# Patient Record
Sex: Male | Born: 1985 | Race: White | Hispanic: No | Marital: Single | Smoking: Never smoker
Health system: Southern US, Community
[De-identification: ages and names within clinical notes are randomized; demographics above are authoritative.]

## PROBLEM LIST (undated history)

## (undated) DIAGNOSIS — R002 Palpitations: Secondary | ICD-10-CM

## (undated) DIAGNOSIS — R079 Chest pain, unspecified: Secondary | ICD-10-CM

## (undated) DIAGNOSIS — I10 Essential (primary) hypertension: Secondary | ICD-10-CM

## (undated) DIAGNOSIS — R5383 Other fatigue: Secondary | ICD-10-CM

## (undated) DIAGNOSIS — R519 Headache, unspecified: Secondary | ICD-10-CM

## (undated) DIAGNOSIS — F988 Other specified behavioral and emotional disorders with onset usually occurring in childhood and adolescence: Secondary | ICD-10-CM

## (undated) DIAGNOSIS — J302 Other seasonal allergic rhinitis: Secondary | ICD-10-CM

## (undated) DIAGNOSIS — R011 Cardiac murmur, unspecified: Secondary | ICD-10-CM

## (undated) HISTORY — DX: Other specified behavioral and emotional disorders with onset usually occurring in childhood and adolescence: F98.8

## (undated) HISTORY — DX: Other fatigue: R53.83

## (undated) HISTORY — DX: Chest pain, unspecified: R07.9

## (undated) HISTORY — DX: Cardiac murmur, unspecified: R01.1

## (undated) HISTORY — DX: Essential (primary) hypertension: I10

## (undated) HISTORY — DX: Headache, unspecified: R51.9

## (undated) HISTORY — DX: Other seasonal allergic rhinitis: J30.2

## (undated) HISTORY — DX: Palpitations: R00.2

---

## 2010-05-11 ENCOUNTER — Emergency Department (HOSPITAL_COMMUNITY): Admission: EM | Admit: 2010-05-11 | Discharge: 2010-05-11 | Payer: Self-pay | Admitting: Emergency Medicine

## 2020-08-01 ENCOUNTER — Other Ambulatory Visit: Payer: Self-pay

## 2020-08-01 ENCOUNTER — Emergency Department (HOSPITAL_COMMUNITY): Payer: 59

## 2020-08-01 ENCOUNTER — Encounter (HOSPITAL_COMMUNITY): Payer: Self-pay

## 2020-08-01 ENCOUNTER — Emergency Department (HOSPITAL_COMMUNITY)
Admission: EM | Admit: 2020-08-01 | Discharge: 2020-08-01 | Disposition: A | Discharge status: Home / Self Care | Payer: 59 | Attending: Emergency Medicine | Admitting: Emergency Medicine

## 2020-08-01 DIAGNOSIS — I10 Essential (primary) hypertension: Secondary | ICD-10-CM | POA: Insufficient documentation

## 2020-08-01 DIAGNOSIS — J189 Pneumonia, unspecified organism: Secondary | ICD-10-CM

## 2020-08-01 DIAGNOSIS — Z20822 Contact with and (suspected) exposure to covid-19: Secondary | ICD-10-CM | POA: Insufficient documentation

## 2020-08-01 DIAGNOSIS — J181 Lobar pneumonia, unspecified organism: Secondary | ICD-10-CM | POA: Diagnosis not present

## 2020-08-01 DIAGNOSIS — R079 Chest pain, unspecified: Secondary | ICD-10-CM | POA: Diagnosis present

## 2020-08-01 LAB — BASIC METABOLIC PANEL
Anion gap: 13 (ref 5–15)
BUN: 18 mg/dL (ref 6–20)
CO2: 26 mmol/L (ref 22–32)
Calcium: 9.6 mg/dL (ref 8.9–10.3)
Chloride: 99 mmol/L (ref 98–111)
Creatinine, Ser: 1.08 mg/dL (ref 0.61–1.24)
GFR, Estimated: >60 mL/min (ref >60)
Glucose, Bld: 97 mg/dL (ref 70–99)
Potassium: 3.7 mmol/L (ref 3.5–5.1)
Sodium: 138 mmol/L (ref 135–145)

## 2020-08-01 LAB — CBC
HCT: 43.6 % (ref 39.0–52.0)
Hemoglobin: 15.7 g/dL (ref 13.0–17.0)
MCH: 32.2 pg (ref 26.0–34.0)
MCHC: 36 g/dL (ref 30.0–36.0)
MCV: 89.5 fL (ref 80.0–100.0)
Platelets: 215 10*3/uL (ref 150–400)
RBC: 4.87 MIL/uL (ref 4.22–5.81)
RDW: 11.7 % (ref 11.5–15.5)
WBC: 7.3 10*3/uL (ref 4.0–10.5)
nRBC: 0 % (ref 0.0–0.2)

## 2020-08-01 LAB — TROPONIN I (HIGH SENSITIVITY): Troponin I (High Sensitivity): 3 ng/L (ref <18)

## 2020-08-01 LAB — RESP PANEL BY RT-PCR (FLU A&B, COVID) ARPGX2
Influenza A by PCR: NEGATIVE
Influenza B by PCR: NEGATIVE
SARS Coronavirus 2 by RT PCR: NEGATIVE

## 2020-08-01 MED ORDER — DOXYCYCLINE HYCLATE 100 MG PO CAPS: 100.0000 mg | ORAL_CAPSULE | Freq: Two times a day (BID) | ORAL | 0 refills | Status: DC

## 2020-08-01 NOTE — ED Provider Notes (Signed)
[facility] EMERGENCY DEPARTMENT Provider Note   CSN: [id number] Arrival date & time: 08/01/20  0449     History Chief Complaint  Patient presents with  . Chest Pain    Bruce Mccormick is a 34 y.o. male.  HPI Patient presents with chest pain.  Has had for the last 3 days.  States it is dull in his anterior chest.  Does have some states he is also been very fatigued.  States he has had chills.  Not coughing but does little short of breath.  No sick contacts.  States that he did have a over-the-counter Covid test done yesterday and was negative. States he does have a family history of early cardiac disease.  He himself is never been looked at for his chest pain.  Does not smoke.  No history of high blood pressure.  States he is on Wellbutrin.  States he sees [place] primary somewhat frequently has not had elevated blood pressure before.    History reviewed. No pertinent past medical history.  There are no problems to display for this patient.   History reviewed. No pertinent surgical history.     No family history on file.     Home Medications Prior to Admission medications   Medication Sig Start Date End Date Taking? Authorizing Provider  doxycycline (VIBRAMYCIN) 100 MG capsule Take 1 capsule (100 mg total) by mouth 2 (two) times daily. 08/01/20   Benjiman Core, MD    Allergies    Patient has no allergy information on record.  Review of Systems   Review of Systems  Constitutional: Positive for chills. Negative for appetite change.  HENT: Negative for congestion.   Respiratory: Positive for shortness of breath. Negative for cough.   Cardiovascular: Positive for chest pain. Negative for leg swelling.  Gastrointestinal: Negative for abdominal pain.  Genitourinary: Negative for flank pain.  Musculoskeletal: Negative for back pain.  Skin: Negative for rash.  Neurological: Negative for weakness.  Psychiatric/Behavioral: Negative for confusion.     Physical Exam Updated Vital Signs BP (!) 151/106 (BP Location: Left Arm)   Pulse 85   Temp 97.6 F (36.4 C)   Resp 20   SpO2 100%   Physical Exam Vitals and nursing note reviewed.  HENT:     Head: Normocephalic.  Cardiovascular:     Rate and Rhythm: Normal rate.     Heart sounds: No murmur heard.   Pulmonary:     Breath sounds: No wheezing, rhonchi or rales.  Chest:     Chest wall: No tenderness.  Abdominal:     Palpations: Abdomen is soft.  Musculoskeletal:     Cervical back: Neck supple.     Right lower leg: No edema.     Left lower leg: No edema.  Skin:    General: Skin is warm.     Capillary Refill: Capillary refill takes less than 2 seconds.  Neurological:     Mental Status: He is alert and oriented to person, place, and time.     ED Results / Procedures / Treatments   Labs (all labs ordered are listed, but only abnormal results are displayed) Labs Reviewed  RESP PANEL BY RT-PCR (FLU A&B, COVID) ARPGX2  BASIC METABOLIC PANEL  CBC  TROPONIN I (HIGH SENSITIVITY)  TROPONIN I (HIGH SENSITIVITY)    EKG EKG Interpretation  Date/Time:  Wednesday August 01 2020 04:48:43 EST Ventricular Rate:  93 PR Interval:  156 QRS Duration: 102 QT Interval:  346 QTC Calculation: 430  R Axis:   53 Text Interpretation: Normal sinus rhythm Normal ECG Confirmed by Benjiman Core [phone]) on 08/01/2020 8:10:05 AM   Radiology DG Chest 2 View  Result Date: 08/01/2020 CLINICAL DATA:  Chest pain and chest tightness. EXAM: CHEST - 2 VIEW COMPARISON:  None. FINDINGS: Low lung volumes. Subtle nodular opacity in the parahilar left lung may reflect a small area of infectious or inflammatory alveolitis. Right lung clear. No pleural effusion. The cardiopericardial silhouette is within normal limits for size. The visualized bony structures of the thorax show no acute abnormality. IMPRESSION: Ill-defined nodular density left mid lung. Given patient age, this is likely infectious  or inflammatory etiology. Follow-up chest x-ray recommended to ensure resolution. Electronically Signed   By: Kennith Center M.D.   On: 08/01/2020 05:34    Procedures Procedures (including critical care time)  Medications Ordered in ED Medications - No data to display  ED Course  I have reviewed the triage vital signs and the nursing notes.  Pertinent labs & imaging results that were available during my care of the patient were reviewed by me and considered in my medical decision making (see chart for details).    MDM Rules/Calculators/A&P                          Patient with chest pain.  Has had for the last 3 days.  Dull.  Not exertional.  States he has been more tired however.  X-ray showed possible pneumonia.  With the chills he has been having will treat with antibiotics.  However has only had a Covid antigen test and Covid antibody test has been ordered.  Doubt cardiac cause of the chest pain.  Although he is at higher risk of family history he is otherwise low risk.  EKG reassuring troponin negative.  Heart pathway score of 2.  Will need outpatient follow-up due to the family history however. Blood pressure also elevated.  I think could be reactive to the stress of being in the ER since he has not had low blood pressure before.  With reassuring blood work and EKG do not think we need acute treatment but need short-term follow-up for recheck.  Has a primary care doctor he can follow-up with.  Initial differential diagnosis included but was not limited to pulmonary embolism coronary artery disease/ischemic heart disease.  Pneumonia pneumothorax nonspecific chest pain. Final Clinical Impression(s) / ED Diagnoses Final diagnoses:  Hypertension, unspecified type  Community acquired pneumonia of left lung, unspecified part of lung    Rx / DC Orders ED Discharge Orders         Ordered    doxycycline (VIBRAMYCIN) 100 MG capsule  2 times daily        08/01/20 0829            Benjiman Core, MD 08/01/20 [phone]

## 2020-08-01 NOTE — Discharge Instructions (Addendum)
Your x-ray showed a possible pneumonia.  With the fatigue and chills she been having we will treat it.  Your EKG and blood work for your heart both reassuring but with a family history you probably need following up with cardiology or released your primary care doctor. Your blood pressure was elevated and needs to be followed in the next few days for recheck.  Your Covid test was done but not resulted at the time of discharge.

## 2020-08-01 NOTE — ED Triage Notes (Signed)
Pt here for eval of central CP, described as tightness, also endorses "slight tingling" in L arm. Pt family hx of heart disease on both sides, denies cardiac history himself. Pt denies N/V, dizziness, hypertensive in triage.

## 2020-08-29 ENCOUNTER — Other Ambulatory Visit: Payer: Self-pay

## 2020-08-29 ENCOUNTER — Ambulatory Visit: Payer: 59 | Admitting: Internal Medicine

## 2020-08-29 ENCOUNTER — Encounter: Payer: Self-pay | Admitting: *Deleted

## 2020-08-29 ENCOUNTER — Encounter: Payer: Self-pay | Admitting: Internal Medicine

## 2020-08-29 VITALS — BP 142/94 | HR 108 | Ht 67.0 in | Wt 219.0 lb

## 2020-08-29 DIAGNOSIS — I10 Essential (primary) hypertension: Secondary | ICD-10-CM | POA: Diagnosis not present

## 2020-08-29 DIAGNOSIS — R079 Chest pain, unspecified: Secondary | ICD-10-CM

## 2020-08-29 MED ORDER — HYDROCHLOROTHIAZIDE 25 MG PO TABS: 25.0000 mg | ORAL_TABLET | Freq: Every day | ORAL | 6 refills | Status: DC

## 2020-08-29 MED ORDER — AMLODIPINE BESYLATE 10 MG PO TABS: 10.0000 mg | ORAL_TABLET | Freq: Every day | ORAL | 6 refills | Status: DC

## 2020-08-29 NOTE — Progress Notes (Signed)
Cardiology Office Note:    Date:  08/29/2020   ID:  Bruce Mccormick, DOB [birth year], MRN [id number]  PCP:  Alvia [place], [facility]  [facility] Cardiologist:  Christell ConstantMahesh A Rande Roylance, MD  [facility] Electrophysiologist:  None   CC: Chest pain  Consulted for the evaluation of chest pain and palpitations at the behest of [place], [facility]  History of Present Illness:    Bruce SeraMason Wever is a 35 y.o. male with a hx of chest pain and palpitations who presents for evaluation.  Patient notes that he is feeling pretty run down. One month ago had fevers and palpitations at work; had negative COVID-19 test.  Had frequent palpitations.  Had EF evaluation with concerns for PNA and started doxycycline.  Elevated blood pressure at that time.  Was told that he has high blood pressure, taken clonidine and felt worse.   Has had chest pain that correlates to increase in his blood pressure.  Also associated with headache.  Discomfort occurs with BP in the 120 or 110.  Patient exertion notable for significant change in exercise; in the non-distant past had ran a marathon.  Notes new weight gain.  Notes no palpitations or funny heart beats.   His heart rate has been running fast since he has been having his elevation in blood pressure and having pain  Ambulatory BP 150-160/108-110; thought his cuff runs a bit higher than manual.   Past Medical History:  Diagnosis Date  . ADD (attention deficit disorder)   . Chest pain   . Fatigue   . Frequent headaches   . Heart murmur   . Hypertension   . Palpitations   . Seasonal allergies     History reviewed. No pertinent surgical history.  Current Medications: Current Meds  Medication Sig  . amLODipine (NORVASC) 10 MG tablet Take 1 tablet (10 mg total) by mouth daily.  . hydrochlorothiazide (HYDRODIURIL) 25 MG tablet Take 1 tablet (25 mg total) by mouth daily.  Marland Kitchen. ibuprofen (ADVIL) 800 MG tablet Take 1 tablet by mouth as directed.   . zolpidem (AMBIEN CR) 12.5 MG CR tablet Take 12.5 mg by mouth at bedtime as needed.  . [DISCONTINUED] allopurinol (ZYLOPRIM) 100 MG tablet Take 100 mg by mouth daily.  . [DISCONTINUED] amLODipine (NORVASC) 5 MG tablet Take 1.5 tablets by mouth daily.  . [DISCONTINUED] buPROPion (WELLBUTRIN XL) 150 MG 24 hr tablet Take 150 mg by mouth every morning.  . [DISCONTINUED] buPROPion (WELLBUTRIN XL) 300 MG 24 hr tablet Take 300 mg by mouth every morning.  . [DISCONTINUED] doxycycline (VIBRAMYCIN) 100 MG capsule Take 1 capsule (100 mg total) by mouth 2 (two) times daily.  . [DISCONTINUED] hydrochlorothiazide (HYDRODIURIL) 25 MG tablet Take 0.5 tablets by mouth as directed. 1/2 table for 2 weeks, then increase to 1 tablet in the morning     Allergies:   Patient has no allergy information on record.   Social History   Socioeconomic History  . Marital status: Single    Spouse name: Not on file  . Number of children: Not on file  . Years of education: Not on file  . Highest education level: Not on file  Occupational History  . Not on file  Tobacco Use  . Smoking status: Never Smoker  . Smokeless tobacco: Never Used  Substance and Sexual Activity  . Alcohol use: Yes    Alcohol/week: 2.0 standard drinks    Types: 2 Shots of liquor per week  .  Drug use: Never  . Sexual activity: Not on file  Other Topics Concern  . Not on file  Social History Narrative  . Not on file   Social Determinants of Health   Financial Resource Strain: Not on file  Food Insecurity: Not on file  Transportation Needs: Not on file  Physical Activity: Not on file  Stress: Not on file  Social Connections: Not on file     Family History: The patient's family history includes Heart Problems in his father and mother. History of coronary artery disease notable for multiple family members in 85s. History of heart failure notable for no members.  ROS:   Please see the history of present illness.     All other  systems reviewed and are negative.  EKGs/Labs/Other Studies Reviewed:    The following studies were reviewed today:  EKG:   08/01/20:  SR rate 93 WNL  Recent Labs: 08/01/2020: BUN 18; Creatinine, Ser 1.08; Hemoglobin 15.7; Platelets 215; Potassium 3.7; Sodium 138  Recent Lipid Panel No results found for: CHOL, TRIG, HDL, CHOLHDL, VLDL, LDLCALC, LDLDIRECT   Risk Assessment/Calculations:     N/A  Physical Exam:    VS:  BP (!) 142/94   Pulse (!) 108   Ht 5\' 7"  (1.702 m)   Wt 219 lb (99.3 kg)   SpO2 97%   BMI 34.30 kg/m     Wt Readings from Last 3 Encounters:  08/29/20 219 lb (99.3 kg)     GEN:  Well nourished, well developed in no acute distress HEENT: Normal NECK: No JVD; No carotid bruits LYMPHATICS: No lymphadenopathy CARDIAC: RRR, no murmurs, rubs, gallops RESPIRATORY:  Clear to auscultation without rales, wheezing or rhonchi  ABDOMEN: Soft, non-tender, non-distended MUSCULOSKELETAL:  No edema; No deformity  SKIN: Warm and dry NEUROLOGIC:  Alert and oriented x 3 PSYCHIATRIC:  Normal affect   ASSESSMENT:    1. Chest pain of uncertain etiology   2. Hypertension, unspecified type    PLAN:    Chest Pain - Would recommend exercise stress test (NPO at midnight); discussed risks, benefits, and alternatives of the diagnostic procedure including chest pain, arrhythmia, and death.  Patient amenable for testing. - if positive, discussed risks and benefits of cardiac catheterization have been discussed with the patient.  These include bleeding, infection, kidney damage, stroke, heart attack, death.  The patient understands these risks and is willing to proceed if necessary  Essential Hypertension - ambulatory blood pressure 150-160/108-110; thought his cuff runs a bit higher than manual, will continue ambulatory BP monitoring; gave education on how to perform ambulatory blood pressure monitoring including the frequency and technique; goal ambulatory blood pressure <  135/85 on average - continue home medications with the exception of increase your amlodipine and HCTZ - will get labs in 10-14 days (BMET, Mg) at Pharm D visit - OSA Risk low , Epworth Sleepiness Scale less than than 15 - discussed causes of  secondary HTN, will order renal artery duplex, and Aldo/renin if persistent elevation in BP despite three maxed out medications including diuretic - discussed diet (DASH/low sodium), and exercise/weight loss interventions    Shared Decision Making/Informed Consent The risks [chest pain, shortness of breath, cardiac arrhythmias, dizziness, blood pressure fluctuations, myocardial infarction, stroke/transient ischemic attack, and life-threatening complications (estimated to be 1 in 10,000)], benefits (risk stratification, diagnosing coronary artery disease, treatment guidance) and alternatives of an exercise tolerance test were discussed in detail with Mr. Humble and he agrees to proceed.  Medication Adjustments/Labs and Tests Ordered: Current medicines are reviewed at length with the patient today.  Concerns regarding medicines are outlined above.  Orders Placed This Encounter  Procedures  . Basic Metabolic Panel (BMET)  . Magnesium  . AMB Referral to [facility] Pharm-D  . Cardiac Stress Test: Informed Consent Details: Physician/Practitioner Attestation; Transcribe to consent form and obtain patient signature  . EXERCISE TOLERANCE TEST (ETT)   Meds ordered this encounter  Medications  . hydrochlorothiazide (HYDRODIURIL) 25 MG tablet    Sig: Take 1 tablet (25 mg total) by mouth daily.    Dispense:  30 tablet    Refill:  6    Dose increase  . amLODipine (NORVASC) 10 MG tablet    Sig: Take 1 tablet (10 mg total) by mouth daily.    Dispense:  30 tablet    Refill:  6    Dose increase    Patient Instructions  Medication Instructions:  Your physician has recommended you make the following change in your medication: Increase amlodipine to 10 mg  by mouth daily. Increase hydrochlorothiazide to 25 mg by mouth daily  *If you need a refill on your cardiac medications before your next appointment, please call your pharmacy*   Lab Work: Your physician recommends that you return for lab work on day of appointment in hypertension clinic--BMP  If you have labs (blood work) drawn today and your tests are completely normal, you will receive your results only by: Marland Kitchen MyChart Message (if you have MyChart) OR . A paper copy in the mail If you have any lab test that is abnormal or we need to change your treatment, we will call you to review the results.   Testing/Procedures: Your physician has requested that you have an exercise tolerance test. For further information please visit [email or web address] Please also follow instruction sheet, as given. You will need covid testing prior to stress test  You have been referred to Hypertension clinic.  Please schedule appointment for 2-3 weeks from now    Follow-Up: At [facility], you and your health needs are our priority.  As part of our continuing mission to provide you with exceptional heart care, we have created designated Provider Care Teams.  These Care Teams include your primary Cardiologist (physician) and Advanced Practice Providers (APPs -  Physician Assistants and Nurse Practitioners) who all work together to provide you with the care you need, when you need it.  We recommend signing up for the patient portal called "MyChart".  Sign up information is provided on this After Visit Summary.  MyChart is used to connect with patients for Virtual Visits (Telemedicine).  Patients are able to view lab/test results, encounter notes, upcoming appointments, etc.  Non-urgent messages can be sent to your provider as well.   To learn more about what you can do with MyChart, go to [email or web address].    Your next appointment:   About 6  week(s)  The format for your next appointment:   In  Person  Provider:   You may see Christell Constant, MD or one of the following Advanced Practice Providers on your designated Care Team:    Ronie Spies, PA-C  Jacolyn Reedy, PA-C    Other Instructions  Due to recent COVID-19 restrictions implemented by our local and state authorities and in an effort to keep both patients and staff as safe as possible, our hospital system requires COVID-19 testing prior to certain scheduled hospital procedures.  Please go to [address]. [place],  [place] [address].  This is a drive up testing site.  You will not need to exit your vehicle.  You will not be billed at the time of testing but may receive a bill later depending on your insurance. You must agree to self-quarantine from the time of your testing until the procedure date  This should included staying home with ONLY the people you live with.  Avoid take-out, grocery store shopping or leaving the house for any non-emergent reason.  Failure to have your COVID-19 test done on the date and time you have been scheduled will result in cancellation of your procedure.  Please call our office at [phone] if you have any questions.      Signed, Christell Constant, MD  08/29/2020 10:32 AM    [facility]

## 2020-08-29 NOTE — Patient Instructions (Signed)
Medication Instructions:  Your physician has recommended you make the following change in your medication: Increase amlodipine to 10 mg by mouth daily. Increase hydrochlorothiazide to 25 mg by mouth daily  *If you need a refill on your cardiac medications before your next appointment, please call your pharmacy*   Lab Work: Your physician recommends that you return for lab work on day of appointment in hypertension clinic--BMP  If you have labs (blood work) drawn today and your tests are completely normal, you will receive your results only by: Marland Kitchen MyChart Message (if you have MyChart) OR . A paper copy in the mail If you have any lab test that is abnormal or we need to change your treatment, we will call you to review the results.   Testing/Procedures: Your physician has requested that you have an exercise tolerance test. For further information please visit [email or web address] Please also follow instruction sheet, as given. You will need covid testing prior to stress test  You have been referred to Hypertension clinic.  Please schedule appointment for 2-3 weeks from now    Follow-Up: At [facility], you and your health needs are our priority.  As part of our continuing mission to provide you with exceptional heart care, we have created designated Provider Care Teams.  These Care Teams include your primary Cardiologist (physician) and Advanced Practice Providers (APPs -  Physician Assistants and Nurse Practitioners) who all work together to provide you with the care you need, when you need it.  We recommend signing up for the patient portal called "MyChart".  Sign up information is provided on this After Visit Summary.  MyChart is used to connect with patients for Virtual Visits (Telemedicine).  Patients are able to view lab/test results, encounter notes, upcoming appointments, etc.  Non-urgent messages can be sent to your provider as well.   To learn more about what you can do with  MyChart, go to [email or web address].    Your next appointment:   About 6  week(s)  The format for your next appointment:   In Person  Provider:   You may see Christell Constant, MD or one of the following Advanced Practice Providers on your designated Care Team:    Ronie Spies, PA-C  Jacolyn Reedy, PA-C    Other Instructions  Due to recent COVID-19 restrictions implemented by our local and state authorities and in an effort to keep both patients and staff as safe as possible, our hospital system requires COVID-19 testing prior to certain scheduled hospital procedures.  Please go to [phone]. [place], [place] [address].  This is a drive up testing site.  You will not need to exit your vehicle.  You will not be billed at the time of testing but may receive a bill later depending on your insurance. You must agree to self-quarantine from the time of your testing until the procedure date  This should included staying home with ONLY the people you live with.  Avoid take-out, grocery store shopping or leaving the house for any non-emergent reason.  Failure to have your COVID-19 test done on the date and time you have been scheduled will result in cancellation of your procedure.  Please call our office at [phone] if you have any questions.

## 2020-09-04 ENCOUNTER — Other Ambulatory Visit (HOSPITAL_COMMUNITY)
Admission: RE | Admit: 2020-09-04 | Discharge: 2020-09-04 | Disposition: A | Discharge status: Home / Self Care | Payer: 59 | Source: Ambulatory Visit | Attending: Internal Medicine | Admitting: Internal Medicine

## 2020-09-04 DIAGNOSIS — Z01812 Encounter for preprocedural laboratory examination: Secondary | ICD-10-CM | POA: Insufficient documentation

## 2020-09-04 DIAGNOSIS — Z20822 Contact with and (suspected) exposure to covid-19: Secondary | ICD-10-CM | POA: Diagnosis not present

## 2020-09-04 LAB — SARS CORONAVIRUS 2 (TAT 6-24 HRS): SARS Coronavirus 2: NEGATIVE

## 2020-09-06 ENCOUNTER — Ambulatory Visit (INDEPENDENT_AMBULATORY_CARE_PROVIDER_SITE_OTHER): Payer: 59

## 2020-09-06 ENCOUNTER — Other Ambulatory Visit: Payer: Self-pay

## 2020-09-06 DIAGNOSIS — I10 Essential (primary) hypertension: Secondary | ICD-10-CM | POA: Diagnosis not present

## 2020-09-06 LAB — EXERCISE TOLERANCE TEST
Estimated workload: 11.5 METS
Exercise duration (min): 9 min
Exercise duration (sec): 37 s
MPHR: 187 {beats}/min
Peak HR: 164 {beats}/min
Percent HR: 87 %
RPE: 17
Rest HR: 93 {beats}/min

## 2020-09-09 NOTE — Progress Notes (Signed)
Patient ID: Bruce Mccormick                 DOB: [birth year]                      MRN: [id number]     HPI: Bruce Mccormick is a 35 y.o. male referred by Dr. Izora Ribas to HTN clinic. PMH is significant for HTN, depression, anxiety, ADD, and gout. He was recently hospitalized 08/01/2020 for 3-day chest pain with a negative over-the-counter COVID test. He was found to have pneumonia and treated with doxycycline. He was referred to Dr. Tiajuana Amass for f/u on chest pain and was last seen in clinic 08/29/2020. Clinic BP 142/94 mmHg and reported ambulatory readings ranging from 150-160/108-110, and pt believes that his cuff runs higher than manual. His  amlodipine increased to 10 mg daily and HCTZ increased to 25 mg daily.  He underwent a stress test 09/06/20 which was low risk. Baseline blood pressure was 144/103 mmHg and rose to 224/99 mmHg with no EKG changes or chest pain. He was continued on his current BP regimen.   Today he presents for HTN clinic for initial visit. He reports that since increasing amlodipine and HCTZ, he does not feel any improvement and actually feels worse. He states that his headaches have gotten worse. He reports that his chest pain has not improved and that he "feels like his heart is beating out of [my] chest when going to bed." Of note, he reports that he self-discontinued bupropion XL (300 mg daily) 3-4 weeks ago to see if this would help with lowering BP. He also reports discontinuing allopurinol secondary to no recent gout flares, with most recent episode in mid-November. Has not needed to take colchicine.   He reports seeing his PCP last Friday to fill out FMLA paperwork. During visit, he reports having the lowest BP reading since starting his medications with an SBP of 132 and does not remember DBP. He reports that his PCP said that his heart "was working hard." He was checking his BP at home with an Omron cuff 1-3x a day but decreased it to once daily after PCP advised that BP  does not to be checked that frequently. He has now stopped checking his BP for over two weeks. He states that he knows when his BP is high because his headache worsens. He denies any new stressors in his life. He would like to get back to work as soon as he can. Clinic BP today 138/100 mmHg.   Current HTN meds:  Amlodipine 10 mg daily  HCTZ 25 mg daily  Previously tried: Clonidine 0.1 mg BID - took and "felt worse"  BP goal: < 130/80 mmHg  Family History: Heart problems in father/mother, CAD in multiple family members in their 62s  Social History: Never smoked, 2.0 standard drinks/week  Diet: Mostly snacks and 1 full meal/day d/t being on third shift schedule for his job. On the weekends will eat 2 full meals/day. Usually more carbs and meat.  Exercise: Used to go to gym, ran a marathon about 5 years ago. 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 He would like to get back to the gym but first wants to return to his job.  Home BP readings: not checking at home  Labs:  08/01/2020: Na 138, K 3.7, Scr 1.08   Wt Readings from Last 3 Encounters:  08/29/20 219 lb (99.3 kg)   BP Readings from Last 3 Encounters:  08/29/20 (!) 142/94  08/01/20 (!) 152/116   Pulse Readings from Last 3 Encounters:  08/29/20 (!) 108  08/01/20 81    Renal function: CrCl cannot be calculated (Patient's most recent lab result is older than the maximum 21 days allowed.).  Past Medical History:  Diagnosis Date  . ADD (attention deficit disorder)   . Chest pain   . Fatigue   . Frequent headaches   . Heart murmur   . Hypertension   . Palpitations   . Seasonal allergies     Current Outpatient Medications on File Prior to Visit  Medication Sig Dispense Refill  . amLODipine (NORVASC) 10 MG tablet Take 1 tablet (10 mg total) by mouth daily. 30 tablet 6  . hydrochlorothiazide (HYDRODIURIL) 25 MG tablet Take 1 tablet (25 mg total) by mouth daily. 30 tablet 6  . ibuprofen (ADVIL) 800 MG tablet  Take 1 tablet by mouth as directed.    . zolpidem (AMBIEN CR) 12.5 MG CR tablet Take 12.5 mg by mouth at bedtime as needed.     No current facility-administered medications on file prior to visit.    Not on File   Assessment/Plan:  1. Hypertension - BP 138/100 mmHg elevated and above goal < 130/80 mmHg. Pt also still experiencing chest pain despite normal stress test. Pt also having no improvement in headaches, however, unclear what is the cause d/t multiple medication changes in the last weeks including increasing amlodipine, increasing HCTZ, self-discontinuing high dose bupropion, and pt not checking BP at home. Will start carvedilol 3.125 mg BID secondary to increased heart rate and uncontrolled BP. Will continue amlodipine 10 mg daily and HCTZ 25 mg daily. Checking BMET today due to recent increase in HCTZ dose. Encouraged patient to restart checking BP once a day and whenever he feels that his BP is elevated. Pt encouraged to bring BP log at next visit. Pt encouraged to swap 1 snack for 1 fruit throughout the day. Provided patient with Dash diet pamphlet for low sodium diet plan. Encouraged patient to begin walking 10 minutes a day as tolerated to help with blood pressure lowering while he waits to go back to work. Will f/u with patient in 2 weeks for BP check. Discussed plan with Dr. Izora Ribas and he is in agreement with beta blocker initiation and feels that chest pain is secondary to uncontrolled HTN. He will f/u with patient mid-February as well.   Sula Soda, PharmD Candidate  Megan E. Supple, PharmD, BCACP, CPP [facility] [address], [place], [place] [address] Phone: [phone]; Fax: [phone] 09/10/2020 12:27 PM

## 2020-09-10 ENCOUNTER — Ambulatory Visit (INDEPENDENT_AMBULATORY_CARE_PROVIDER_SITE_OTHER): Payer: 59 | Admitting: Pharmacist

## 2020-09-10 ENCOUNTER — Other Ambulatory Visit: Payer: 59 | Admitting: *Deleted

## 2020-09-10 ENCOUNTER — Other Ambulatory Visit: Payer: Self-pay

## 2020-09-10 DIAGNOSIS — I10 Essential (primary) hypertension: Secondary | ICD-10-CM | POA: Diagnosis not present

## 2020-09-10 DIAGNOSIS — R072 Precordial pain: Secondary | ICD-10-CM | POA: Insufficient documentation

## 2020-09-10 DIAGNOSIS — R079 Chest pain, unspecified: Secondary | ICD-10-CM

## 2020-09-10 LAB — MAGNESIUM: Magnesium: 2.1 mg/dL (ref 1.6–2.3)

## 2020-09-10 LAB — BASIC METABOLIC PANEL
BUN/Creatinine Ratio: 16 (ref 9–20)
BUN: 15 mg/dL (ref 6–20)
CO2: 27 mmol/L (ref 20–29)
Calcium: 9.5 mg/dL (ref 8.7–10.2)
Chloride: 102 mmol/L (ref 96–106)
Creatinine, Ser: 0.94 mg/dL (ref 0.76–1.27)
GFR calc Af Amer: 123 mL/min/{1.73_m2} (ref >59)
GFR calc non Af Amer: 106 mL/min/{1.73_m2} (ref >59)
Glucose: 106 mg/dL — ABNORMAL HIGH (ref 65–99)
Potassium: 4.3 mmol/L (ref 3.5–5.2)
Sodium: 141 mmol/L (ref 134–144)

## 2020-09-10 MED ORDER — CARVEDILOL 3.125 MG PO TABS: 3.1250 mg | ORAL_TABLET | Freq: Two times a day (BID) | ORAL | 11 refills | Status: DC

## 2020-09-10 NOTE — Patient Instructions (Addendum)
It was great to meet you today!  Your blood pressure today was 138/100 mmHg. Your blood pressure goal is less than 130/80 mmHg.  Start taking carvedilol 1 tablet (3.125 mg) twice a day.  Continue taking amlodipine 1 tablet (10 mg) every day.   Continue taking hydrochlorothiazide 1 tablet (25 mg) every day.   Start checking your blood pressure every day and whenever you feel that your blood pressure is high.  Continue to make healthy diet and exercise choices.   We will see you in 2 weeks to check your blood pressure.

## 2020-09-12 ENCOUNTER — Telehealth: Payer: Self-pay

## 2020-09-12 NOTE — Telephone Encounter (Signed)
Left patient a message to return call to office regarding lab results.

## 2020-09-12 NOTE — Telephone Encounter (Signed)
-----   Message from Christell Constant, MD sent at 09/12/2020  8:25 AM EST ----- Results: Stable labs Plan: Continue current plan  Christell Constant, MD

## 2020-09-26 ENCOUNTER — Ambulatory Visit (INDEPENDENT_AMBULATORY_CARE_PROVIDER_SITE_OTHER): Payer: 59 | Admitting: Pharmacist

## 2020-09-26 ENCOUNTER — Other Ambulatory Visit: Payer: Self-pay

## 2020-09-26 VITALS — BP 138/100 | HR 85

## 2020-09-26 DIAGNOSIS — M109 Gout, unspecified: Secondary | ICD-10-CM | POA: Insufficient documentation

## 2020-09-26 DIAGNOSIS — I10 Essential (primary) hypertension: Secondary | ICD-10-CM

## 2020-09-26 DIAGNOSIS — F418 Other specified anxiety disorders: Secondary | ICD-10-CM | POA: Insufficient documentation

## 2020-09-26 MED ORDER — CARVEDILOL 6.25 MG PO TABS: 6.2500 mg | ORAL_TABLET | Freq: Two times a day (BID) | ORAL | 11 refills | Status: DC

## 2020-09-26 MED ORDER — CHLORTHALIDONE 25 MG PO TABS: 25.0000 mg | ORAL_TABLET | Freq: Every day | ORAL | 3 refills | Status: DC

## 2020-09-26 NOTE — Patient Instructions (Addendum)
It was nice to see you today  Your blood pressure goal is < 130/48mmHg  STOP taking hydrochlorothiazide and START taking chlorthalidone 25mg  once daily  INCREASE your carvedilol to 6.25mg  twice daily. You can double up on your current dose and take 2 tablets twice daily until you run out, then pick up your new prescription and go back to taking 1 tablet twice daily  Continue taking your amlodipine once daily  Continue to monitor your blood pressure at home. Focus on eating a low sodium, heart healthy diet and staying active for at least 150 minutes each week  Keep your follow up appointment in 3 weeks

## 2020-09-26 NOTE — Progress Notes (Signed)
Patient ID: Bruce Mccormick                 DOB: Aug 08, 1986                      MRN: [id number]     HPI: Bruce Mccormick is a 35 y.o. male referred by Dr. Izora Ribas to HTN clinic. PMH is significant for HTN, depression, anxiety, ADD, and gout. He was recently hospitalized 08/01/2020 for 3-day chest pain with a negative over-the-counter COVID test. He was found to have pneumonia and treated with doxycycline. He was referred to Dr. Tiajuana Amass for f/u on chest pain and was last seen in clinic 08/29/2020. Clinic BP 142/94 mmHg and reported ambulatory readings ranging from 150-160/108-110, and pt believes that his cuff runs higher than manual. His  amlodipine increased to 10 mg daily and HCTZ increased to 25 mg daily. He underwent a stress test 09/06/20 which was low risk. Baseline blood pressure was 144/103 mmHg and rose to 224/99 mmHg with no EKG changes or chest pain. He was continued on his current BP regimen. At last visit with PharmD on 1/24, BP was 138/100 and low dose carvedilol was added, BMET was stable. Discussed with Dr Izora Ribas who felt that continued chest pain was secondary to uncontrolled HTN.  Pt presents today in good spirits. Reports tolerating his medications well. Still overall feeling bad, having some chest tightness but it's getting better. Still having constant headaches. Slipped on the ice and cracked his tailbone the other week when he was taking out his new puppy. Saw PCP the same day, BP was 155/90, started on tramadol and cyclobenzaprine.  He purchased a new bicep BP cuff. Most BPs in the 130s/90-100, HR upper 80s. Works 3rd shift (usually sleeps 1-9pm), inquires about best time of day to take his meds. Has been walking his puppy for about 90 minutes each day and has been trying to clean up his diet. Eating less vending machine food. Drinking less alcohol. Avoiding NSAIDs. Inquires about the safety of resuming prn Xanax for stressful situations at work, previously would help to  calm him.  Current HTN meds:  Amlodipine 10 mg daily  HCTZ 25 mg daily Carvedilol 3.125mg  BID  Previously tried: Clonidine 0.1 mg BID - took and "felt worse"  BP goal: < 130/80 mmHg  Family History: Heart problems in father/mother, CAD in multiple family members in their 27s  Social History: Never smoked, 2.0 standard drinks/week  Diet: Mostly snacks and 1 full meal/day d/t being on third shift schedule for his job. On the weekends will eat 2 full meals/day. Usually more carbs and meat.  Exercise: Used to go to gym, ran a marathon about 5 years ago. 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 He would like to get back to the gym but first wants to return to his job.  Home BP readings: not checking at home  Labs:  08/01/2020: Na 138, K 3.7, Scr 1.08 09/10/20: Na 141, K 4.3, SCr 0.94  Wt Readings from Last 3 Encounters:  08/29/20 219 lb (99.3 kg)   BP Readings from Last 3 Encounters:  09/10/20 (!) 138/100  08/29/20 (!) 142/94  08/01/20 (!) 152/116   Pulse Readings from Last 3 Encounters:  09/10/20 88  08/29/20 (!) 108  08/01/20 81    Renal function: CrCl cannot be calculated (Unknown ideal weight.).  Past Medical History:  Diagnosis Date  . ADD (attention deficit disorder)   . Chest pain   .  Fatigue   . Frequent headaches   . Heart murmur   . Hypertension   . Palpitations   . Seasonal allergies     Current Outpatient Medications on File Prior to Visit  Medication Sig Dispense Refill  . amLODipine (NORVASC) 10 MG tablet Take 1 tablet (10 mg total) by mouth daily. 30 tablet 6  . carvedilol (COREG) 3.125 MG tablet Take 1 tablet (3.125 mg total) by mouth 2 (two) times daily. 60 tablet 11  . hydrochlorothiazide (HYDRODIURIL) 25 MG tablet Take 1 tablet (25 mg total) by mouth daily. 30 tablet 6  . ibuprofen (ADVIL) 800 MG tablet Take 1 tablet by mouth as directed.    . zolpidem (AMBIEN CR) 12.5 MG CR tablet Take 12.5 mg by mouth at bedtime as needed.      No current facility-administered medications on file prior to visit.    Not on File   Assessment/Plan:  1. Hypertension - BP remains elevated and above goal < 130/80 mmHg. Will change HCTZ 25mg  to chlorthalidone 25mg  daily for better BP lowering. Will also increase carvedilol to 6.25mg  BID. Continue amlodipine 10mg  daily. Encouraged pt to continue with 150 minutes of exercise weekly and to continue with dietary improvements; he was provided with DASH diet handout at last visit. Pt will keep f/u with MD in 3 weeks, can f/u with PharmD as needed thereafter.  Teri Legacy E. Duvall Comes, PharmD, BCACP, CPP [facility] [address] [place], [address] Phone: [phone]; Fax: [phone] 09/26/2020 7:55 AM

## 2020-10-10 ENCOUNTER — Ambulatory Visit: Payer: 59 | Admitting: Internal Medicine

## 2020-10-10 ENCOUNTER — Encounter: Payer: Self-pay | Admitting: Internal Medicine

## 2020-10-10 ENCOUNTER — Other Ambulatory Visit: Payer: Self-pay

## 2020-10-10 VITALS — BP 134/94 | HR 98 | Ht 67.0 in | Wt 223.0 lb

## 2020-10-10 DIAGNOSIS — R002 Palpitations: Secondary | ICD-10-CM | POA: Diagnosis not present

## 2020-10-10 DIAGNOSIS — I10 Essential (primary) hypertension: Secondary | ICD-10-CM | POA: Diagnosis not present

## 2020-10-10 MED ORDER — CARVEDILOL 12.5 MG PO TABS: 12.5000 mg | ORAL_TABLET | Freq: Two times a day (BID) | ORAL | 3 refills | Status: DC

## 2020-10-10 NOTE — Patient Instructions (Addendum)
Medication Instructions:  Your physician has recommended you make the following change in your medication:   INCREASE: carvedilol (Coreg) to 12.5mg  by mouth twice daily *If you need a refill on your cardiac medications before your next appointment, please call your pharmacy*   Lab Work: TODAY: aldosterone renin  If you have labs (blood work) drawn today and your tests are completely normal, you will receive your results only by: Marland Kitchen MyChart Message (if you have MyChart) OR . A paper copy in the mail If you have any lab test that is abnormal or we need to change your treatment, we will call you to review the results.   Testing/Procedures: NONE   Follow-Up: At [facility], you and your health needs are our priority.  As part of our continuing mission to provide you with exceptional heart care, we have created designated Provider Care Teams.  These Care Teams include your primary Cardiologist (physician) and Advanced Practice Providers (APPs -  Physician Assistants and Nurse Practitioners) who all work together to provide you with the care you need, when you need it.  We recommend signing up for the patient portal called "MyChart".  Sign up information is provided on this After Visit Summary.  MyChart is used to connect with patients for Virtual Visits (Telemedicine).  Patients are able to view lab/test results, encounter notes, upcoming appointments, etc.  Non-urgent messages can be sent to your provider as well.   To learn more about what you can do with MyChart, go to [email or web address].    Your next appointment:   3 month(s)  The format for your next appointment:   In Person  Provider:    You may see Christell Constant, MD   Other instructions Your physician has referred you to our Pharmacist Clinic to assist in managing  hypertension.

## 2020-10-10 NOTE — Addendum Note (Signed)
Addended by: SUPPLE, MEGAN E on: 10/10/2020 10:07 AM   Modules accepted: Orders

## 2020-10-10 NOTE — Progress Notes (Signed)
Cardiology Office Note:    Date:  10/10/2020   ID:  Bruce Mccormick, DOB 01-07-1986, MRN [id number]  PCP:  [facility] Cardiologist:  Christell Constant, MD  [facility] Electrophysiologist:  None   CC: Follow up chest pain   History of Present Illness:    Bruce Mccormick is a 35 y.o. male with a hx of chest pain and palpitations who presented for evaluation 08/29/20.  In interim of this visit, patient normal stress test; had increase in his regimen: Norvasc 10 mg, Cholthalidone 25 mg Daily, Coreg 3.25 mg BID.   Patient notes that he is feeling pretty rough.  Slipped on the ice and has some tailbone pain and discomfort that has improved. Notes that third shift is really causing him issues, but is back to work.  Headaches were worse at one point, but are getting better.  Also dealing with an ear infection.  Chest pain is better down to only one to two days a week.   No SOB/DOE and no PND/Orthopnea.  No weight gain or leg swelling.   Notes that he feels like his heart is racing:  Has been trying to control it by telling himself to remember to breath.  Ambulatory blood pressure SBP 142-145.   Past Medical History:  Diagnosis Date  . ADD (attention deficit disorder)   . Chest pain   . Fatigue   . Frequent headaches   . Heart murmur   . Hypertension   . Palpitations   . Seasonal allergies     No past surgical history on file.  Current Medications: Current Meds  Medication Sig  . amLODipine (NORVASC) 10 MG tablet Take 1 tablet (10 mg total) by mouth daily.  . carvedilol (COREG) 12.5 MG tablet Take 1 tablet (12.5 mg total) by mouth 2 (two) times daily.  . chlorthalidone (HYGROTON) 25 MG tablet Take 1 tablet (25 mg total) by mouth daily.  . cyclobenzaprine (FLEXERIL) 10 MG tablet Take 10 mg by mouth as needed for muscle spasms.  . traMADol (ULTRAM) 50 MG tablet Take by mouth as needed.  . zolpidem (AMBIEN CR) 12.5 MG CR tablet Take 12.5 mg  by mouth at bedtime as needed.  . [DISCONTINUED] carvedilol (COREG) 6.25 MG tablet Take 1 tablet (6.25 mg total) by mouth 2 (two) times daily.  . [DISCONTINUED] hydrochlorothiazide (HYDRODIURIL) 25 MG tablet Take 25 mg by mouth daily.     Allergies:   Patient has no known allergies.   Social History   Socioeconomic History  . Marital status: Single    Spouse name: Not on file  . Number of children: Not on file  . Years of education: Not on file  . Highest education level: Not on file  Occupational History  . Not on file  Tobacco Use  . Smoking status: Never Smoker  . Smokeless tobacco: Never Used  Substance and Sexual Activity  . Alcohol use: Yes    Alcohol/week: 2.0 standard drinks    Types: 2 Shots of liquor per week  . Drug use: Never  . Sexual activity: Not on file  Other Topics Concern  . Not on file  Social History Narrative  . Not on file   Social Determinants of Health   Financial Resource Strain: Not on file  Food Insecurity: Not on file  Transportation Needs: Not on file  Physical Activity: Not on file  Stress: Not on file  Social Connections: Not on file  Social:  Has a new puppy and some cats; his job is very stressful  Family History: The patient's family history includes Heart Problems in his father and mother. History of coronary artery disease notable for multiple family members in 57s. History of heart failure notable for no members.  ROS:   Please see the history of present illness.     All other systems reviewed and are negative.  EKGs/Labs/Other Studies Reviewed:    The following studies were reviewed today:  EKG:   08/01/20:  SR rate 93 WNL  Recent Labs: 08/01/2020: Hemoglobin 15.7; Platelets 215 09/10/2020: BUN 15; Creatinine, Ser 0.94; Magnesium 2.1; Potassium 4.3; Sodium 141  Recent Lipid Panel No results found for: CHOL, TRIG, HDL, CHOLHDL, VLDL, LDLCALC, LDLDIRECT   Risk Assessment/Calculations:     N/A  Physical Exam:     VS:  BP (!) 134/94 (BP Location: Right Arm)   Pulse 98   Ht 5\' 7"  (1.702 m)   Wt 223 lb (101.2 kg)   SpO2 95%   BMI 34.93 kg/m     Wt Readings from Last 3 Encounters:  10/10/20 223 lb (101.2 kg)  08/29/20 219 lb (99.3 kg)    Left arm BP 138/90 Right arm BP 134/94  GEN:  Well nourished, well developed in no acute distress HEENT: Normal NECK: No JVD; No carotid bruits LYMPHATICS: No lymphadenopathy CARDIAC: RRR, no murmurs, rubs, gallops RESPIRATORY:  Clear to auscultation without rales, wheezing or rhonchi  ABDOMEN: Soft, non-tender, non-distended MUSCULOSKELETAL:  No edema; No deformity  SKIN: Warm and dry NEUROLOGIC:  Alert and oriented x 3 PSYCHIATRIC:  Normal affect   ASSESSMENT:    1. Essential hypertension   2. Palpitations    PLAN:     Essential Hypertension - ambulatory blood pressure 140-  will continue ambulatory BP monitoring; gave education on how to perform ambulatory blood pressure monitoring including the frequency and technique; goal ambulatory blood pressure < 135/85 on average - continue home medications with the exception of 12.5 mg Coreg - low threshold for Imdur 30 mg PO - Pharmd D yes or no - OSA Risk low , Epworth Sleepiness Scale less than than 15 - discussed causes of  secondary HTN, Aldo/renin  - discussed diet (DASH/low sodium), and exercise/weight loss interventions   Palpitations;  - discussed  heart monitor (ZioPatch) if palpitations persist despite coreg increase  Three month (can use provider use and overbook) follow up unless new symptoms or abnormal test results warranting change in plan  Would be reasonable for  Video Visit Follow up Would be reasonable for  APP Follow up  Social Concerns:  Will need Doctor's Note for work  Medication Adjustments/Labs and Tests Ordered: Current medicines are reviewed at length with the patient today.  Concerns regarding medicines are outlined above.  Orders Placed This Encounter   Procedures  . Aldosterone + renin activity w/ ratio  . AMB Referral to [facility] Pharm-D   Meds ordered this encounter  Medications  . carvedilol (COREG) 12.5 MG tablet    Sig: Take 1 tablet (12.5 mg total) by mouth 2 (two) times daily.    Dispense:  180 tablet    Refill:  3    Patient Instructions  Medication Instructions:  Your physician has recommended you make the following change in your medication:   INCREASE: carvedilol (Coreg) to 12.5mg  by mouth twice daily *If you need a refill on your cardiac medications before your next appointment, please call your pharmacy*   Lab Work:  TODAY: aldosterone renin  If you have labs (blood work) drawn today and your tests are completely normal, you will receive your results only by: Marland Kitchen MyChart Message (if you have MyChart) OR . A paper copy in the mail If you have any lab test that is abnormal or we need to change your treatment, we will call you to review the results.   Testing/Procedures: NONE   Follow-Up: At [facility], you and your health needs are our priority.  As part of our continuing mission to provide you with exceptional heart care, we have created designated Provider Care Teams.  These Care Teams include your primary Cardiologist (physician) and Advanced Practice Providers (APPs -  Physician Assistants and Nurse Practitioners) who all work together to provide you with the care you need, when you need it.  We recommend signing up for the patient portal called "MyChart".  Sign up information is provided on this After Visit Summary.  MyChart is used to connect with patients for Virtual Visits (Telemedicine).  Patients are able to view lab/test results, encounter notes, upcoming appointments, etc.  Non-urgent messages can be sent to your provider as well.   To learn more about what you can do with MyChart, go to [email or web address].    Your next appointment:   3 month(s)  The format for your next appointment:   In  Person  Provider:    You may see Christell Constant, MD   Other instructions Your physician has referred you to our Pharmacist Clinic to assist in managing  hypertension.       Signed, Christell Constant, MD  10/10/2020 10:35 AM    [facility]

## 2020-10-18 LAB — ALDOSTERONE + RENIN ACTIVITY W/ RATIO
ALDOS/RENIN RATIO: 8.7 (ref 0.0–30.0)
ALDOSTERONE: 12 ng/dL (ref 0.0–30.0)
Renin: 1.383 ng/mL/hr (ref 0.167–5.380)

## 2020-10-23 NOTE — Progress Notes (Signed)
Patient ID: Bruce Mccormick                 DOB: [birth year]                      MRN: [id number]     HPI: Bruce Mccormick is a 35 y.o. male referred by Dr. Izora Ribas to HTN clinic. PMH is significant for HTN, depression, anxiety, ADD, and gout. He was recently hospitalized 08/01/2020 for 3-day chest pain with a negative over-the-counter COVID test. He was found to have pneumonia and treated with doxycycline. He was referred to Dr. Tiajuana Amass for f/u on chest pain and was seen in clinic 08/29/2020. Clinic BP 142/94 mmHg and reported ambulatory readings ranging from 150-160/108-110, and pt believes that his cuff runs higher than manual. His amlodipine increased to 10 mg daily and HCTZ increased to 25 mg daily. He underwent a stress test 09/06/20 which was low risk. Baseline blood pressure was 144/103 mmHg and rose to 224/99 mmHg with no EKG changes or chest pain. He was continued on his current BP regimen. At visit with PharmD on 1/24, BP was 138/100 and low dose carvedilol was added, BMET was stable. Discussed with Dr. Izora Ribas who felt that continued chest pain was secondary to uncontrolled HTN.  At last visit with PharmD on 2/9, patient reported BP in 130s/90-100 at home with new bicep BP cuff. Still having constant headaches, some chest tightness but improving. Reported improved dietary habits (less vending machine food, alcohol) and exercise, avoiding NSAIDs. Patient works 3rd shift (usually sleeps 1-9pm). BP was 138/100 in office, HCTZ 25mg  was changed to chlorthalidone 25mg  daily for better BP lowering and carvedilol was increased to 6.25mg  BID. Since then, he had a visit with Dr. on 2/23. BP was 134/94 and carvedilol was increased to 12.5 mg BID. He also checked renin and aldosterone and both were normal.   Today, patient reports experiencing stress immediately prior to appointment due to his dog. He is still working third shift but is looking for other jobs on first shift. States that  chest pain/palpitations have improved and now only occur once per week, usually after a stressful day at work. Notes that he feels his BP increase when he experiences stress, however he has not been checking his BP lately at home. Reports no missed doses of BP medications. Denies use of NSAIDs with recent tailbone fracture which he states is healing well. He endorses trying to lose weight which he knows will help improve his BP and cardiac health. BP in office today is 162/104, HR 106, which slightly improved to 154/102, HR 103 after a few minutes.   Current HTN meds:  Amlodipine 10 mg daily  Chlorthalidone 25 mg daily Carvedilol 12.5mg  BID  Previously tried: Clonidine 0.1 mg BID - took and "felt worse"  BP goal: <130/80 mmHg  Family History: Heart problems in father/mother, CAD in multiple family members in their 65s  Social History: Never smoked, 2.0 standard drinks/week  Diet: Mostly snacks and 1 full meal/day d/t being on third shift schedule for his job. On the weekends will eat 2 full meals/day. Usually more carbs and meat.  Exercise: Used to go to gym, ran a marathon about 5 years ago. States his job as 3/23 keeps him very active. He would like to get back to the gym but first wants to return to his job. Now walking 90 mins/day with new puppy.   Home BP readings: Has not been checking lately  Labs:  08/01/2020: Na 138, K 3.7, Scr 1.08 09/10/20: Na 141, K 4.3, SCr 0.94  Wt Readings from Last 3 Encounters:  10/10/20 223 lb (101.2 kg)  08/29/20 219 lb (99.3 kg)   BP Readings from Last 3 Encounters:  10/10/20 (!) 134/94  09/26/20 (!) 138/100  09/10/20 (!) 138/100   Pulse Readings from Last 3 Encounters:  10/10/20 98  09/26/20 85  09/10/20 88    Renal function: CrCl cannot be calculated (Patient's most recent lab result is older than the maximum 21 days allowed.).  Past Medical History:  Diagnosis Date  . ADD (attention deficit disorder)   . Chest pain   .  Fatigue   . Frequent headaches   . Heart murmur   . Hypertension   . Palpitations   . Seasonal allergies     Current Outpatient Medications on File Prior to Visit  Medication Sig Dispense Refill  . amLODipine (NORVASC) 10 MG tablet Take 1 tablet (10 mg total) by mouth daily. 30 tablet 6  . carvedilol (COREG) 12.5 MG tablet Take 1 tablet (12.5 mg total) by mouth 2 (two) times daily. 180 tablet 3  . chlorthalidone (HYGROTON) 25 MG tablet Take 1 tablet (25 mg total) by mouth daily. 90 tablet 3  . cyclobenzaprine (FLEXERIL) 10 MG tablet Take 10 mg by mouth as needed for muscle spasms.    . traMADol (ULTRAM) 50 MG tablet Take by mouth as needed.    . zolpidem (AMBIEN CR) 12.5 MG CR tablet Take 12.5 mg by mouth at bedtime as needed.     No current facility-administered medications on file prior to visit.    No Known Allergies   Assessment/Plan:  1. Hypertension - BP remains elevated and above goal <130/80 mmHg. Due to this, continued palpitations ~1x/week with HR elevated in the 100s, will increase carvedilol to 25 mg BID. Continue amlodipine 10 mg daily and chlorthalidone 25 mg daily. Encouraged pt to continue with 150 minutes of exercise weekly and to continue with dietary improvements including reducing sodium intake to <2000 mg daily. Encouraged pt to resume monitoring BP at home.   Follow up PharmD visit in 4 weeks for BP check. Consider addition of ACEi/ARB at that time if BP still above goal.   Pervis Hocking, PharmD PGY1 Pharmacy Resident 10/24/2020 1:01 PM   Megan E. Supple, PharmD, BCACP, CPP [facility] [address] [place], [place] [address] Phone: [phone]; Fax: [phone] 10/24/2020 1:02 PM

## 2020-10-24 ENCOUNTER — Other Ambulatory Visit: Payer: Self-pay

## 2020-10-24 ENCOUNTER — Ambulatory Visit (INDEPENDENT_AMBULATORY_CARE_PROVIDER_SITE_OTHER): Payer: 59 | Admitting: Pharmacist

## 2020-10-24 ENCOUNTER — Encounter: Payer: Self-pay | Admitting: Pharmacist

## 2020-10-24 VITALS — BP 154/102 | HR 103

## 2020-10-24 DIAGNOSIS — I10 Essential (primary) hypertension: Secondary | ICD-10-CM

## 2020-10-24 MED ORDER — CHLORTHALIDONE 25 MG PO TABS: 25.0000 mg | ORAL_TABLET | Freq: Every day | ORAL | 3 refills | Status: DC

## 2020-10-24 MED ORDER — AMLODIPINE BESYLATE 10 MG PO TABS: 10.0000 mg | ORAL_TABLET | Freq: Every day | ORAL | 3 refills | Status: DC

## 2020-10-24 MED ORDER — CARVEDILOL 25 MG PO TABS: 25.0000 mg | ORAL_TABLET | Freq: Two times a day (BID) | ORAL | 3 refills | Status: DC

## 2020-10-24 NOTE — Patient Instructions (Addendum)
Increase your carvedilol to 25mg  twice daily  Continue taking your other medications  Your blood pressure goal is < 130/8mmHg. Monitor your blood pressure at home  Eat a heart healthy diet and limit your sodium to < 2,000mg  daily. Aim for 150 minutes of exercise each week  Follow up in 4 weeks for a blood pressure check

## 2020-11-22 ENCOUNTER — Encounter: Payer: Self-pay | Admitting: Pharmacist

## 2020-11-22 ENCOUNTER — Other Ambulatory Visit: Payer: Self-pay

## 2020-11-22 ENCOUNTER — Ambulatory Visit (INDEPENDENT_AMBULATORY_CARE_PROVIDER_SITE_OTHER): Payer: 59 | Admitting: Pharmacist

## 2020-11-22 VITALS — BP 156/108 | HR 74

## 2020-11-22 DIAGNOSIS — I10 Essential (primary) hypertension: Secondary | ICD-10-CM | POA: Diagnosis not present

## 2020-11-22 MED ORDER — AMLODIPINE BESYLATE 10 MG PO TABS: 10.0000 mg | ORAL_TABLET | Freq: Every day | ORAL | 3 refills | Status: DC

## 2020-11-22 MED ORDER — VALSARTAN 160 MG PO TABS: 160.0000 mg | ORAL_TABLET | Freq: Every day | ORAL | 11 refills | Status: DC

## 2020-11-22 MED ORDER — CARVEDILOL 25 MG PO TABS: 25.0000 mg | ORAL_TABLET | Freq: Two times a day (BID) | ORAL | 3 refills | Status: DC

## 2020-11-22 NOTE — Patient Instructions (Addendum)
It was nice to see you today  Your blood pressure goal is < 130/11mmhg  Start taking valsartan 160mg  once daily - for the first 3-4 days you can cut your pill in half and take 1/2 tablet, then increase to the 1 tablet daily. Take this at 9pm when you take your second dose of carvedilol  Continue taking your other medications. Monitor and record your blood pressure and bring in your readings to your next visit in 1 month

## 2020-11-22 NOTE — Progress Notes (Signed)
Patient ID: Bruce Mccormick                 DOB: 03-13-1986                      MRN: [id number]     HPI: Bruce Mccormick is a 35 y.o. male referred by Dr. Izora Ribas to HTN clinic. PMH is significant for HTN, depression, anxiety, ADD, and gout. He was recently hospitalized 08/01/2020 for 3-day chest pain with a negative over-the-counter COVID test. He was found to have pneumonia and treated with doxycycline. He was referred to Dr. Tiajuana Amass for f/u on chest pain and was seen in clinic 08/29/2020. He underwent a stress test 09/06/20 which was low risk. Baseline blood pressure was 144/103 mmHg and rose to 224/99 mmHg with no EKG changes or chest pain. Renin and aldosterone both normal. Over the past few months, BP meds have been adjusted as chest pain was felt to be secondary to uncontrolled HTN. At recent visits, amlodipine has been increased to 10mg  daily, HCTZ was changed to chlorthalidone 25mg  daily, and carvedilol dose was increased to 25mg  BID.  Pt presents today in good spirits. Tolerating higher dose of carvedilol well. Did experience chest tightness for about a week after his last visit with me. Since then, it decreased to about 1x/week and typically comes on with stress and is accompanied by headache. Is usually at work when this occurs so he has been unable to check his BP. Hasn't started monitoring at home either. Trying to eat clean and stay active with his new puppy.  Also had a gout flare last night, taking colchicine now and took 600mg  ibuprofen earlier this AM. He has a family history of gout and previously has experienced multiple gout flares. Reports this one came on more suddenly. Previously took allopurinol but stopped this in January.   Current HTN meds:  Amlodipine 10 mg daily - 7am Chlorthalidone 25 mg daily - 7am Carvedilol 25mg  BID - 7am and 9pm  Previously tried: Clonidine 0.1 mg BID - took and "felt worse"  BP goal: <130/80 mmHg  Family History: Heart problems in  father/mother, CAD in multiple family members in their 68s  Social History: Never smoked, 2.0 standard drinks/week  Diet: Mostly snacks and 1 full meal/day d/t being on third shift schedule for his job. On the weekends will eat 2 full meals/day. Usually more carbs and meat.  Exercise: Used to go to gym, ran a marathon about 5 years ago. States his job as keeps him very active. He would like to get back to the gym but first wants to return to his job. Now walking 90 mins/day with new puppy.   Home BP readings: Has not been checking lately  Labs:  08/01/2020: Na 138, K 3.7, Scr 1.08 09/10/20: Na 141, K 4.3, SCr 0.94  Wt Readings from Last 3 Encounters:  10/10/20 223 lb (101.2 kg)  08/29/20 219 lb (99.3 kg)   BP Readings from Last 3 Encounters:  10/24/20 (!) 154/102  10/10/20 (!) 134/94  09/26/20 (!) 138/100   Pulse Readings from Last 3 Encounters:  10/24/20 (!) 103  10/10/20 98  09/26/20 85    Renal function: CrCl cannot be calculated (Patient's most recent lab result is older than the maximum 21 days allowed.).  Past Medical History:  Diagnosis Date  . ADD (attention deficit disorder)   . Chest pain   . Fatigue   . Frequent headaches   . Heart  murmur   . Hypertension   . Palpitations   . Seasonal allergies     Current Outpatient Medications on File Prior to Visit  Medication Sig Dispense Refill  . amLODipine (NORVASC) 10 MG tablet Take 1 tablet (10 mg total) by mouth daily. 90 tablet 3  . carvedilol (COREG) 25 MG tablet Take 1 tablet (25 mg total) by mouth 2 (two) times daily. 180 tablet 3  . chlorthalidone (HYGROTON) 25 MG tablet Take 1 tablet (25 mg total) by mouth daily. 90 tablet 3  . cyclobenzaprine (FLEXERIL) 10 MG tablet Take 10 mg by mouth as needed for muscle spasms.    . traMADol (ULTRAM) 50 MG tablet Take by mouth as needed.    . zolpidem (AMBIEN CR) 12.5 MG CR tablet Take 12.5 mg by mouth at bedtime as needed.     No current  facility-administered medications on file prior to visit.    No Known Allergies   Assessment/Plan:  1. Hypertension - BP remains elevated and above goal <130/80 mmHg. Will start valsartan 160mg  daily and continue carvedilol 25mg  BID, amlodipine 10mg  daily, and chlorthalidone 25mg  daily. Discussed ongoing intermittent chest tightness with Dr , still felt to be secondary to elevated BP. If symptoms continue once BP improves, can consider coronary CTA. Also discussed that his chlorthalidone may have contributed to his recent gout flare. Discussed changing this to another med like spironolactone, however pt wishes to continue on med for now. States he has had gout flares in the past frequently. May have been exacerbated by some drinking over the weekend and pt stopping allopurinol earlier this year which pt likely needs to resume. Encouraged pt to continue with 150 minutes of exercise weekly and to continue with dietary improvements including reducing sodium intake to <2000 mg daily. Encouraged pt to start monitoring BP at home.   Follow up PharmD visit in 1 month for BP check/labs and 2 months with Dr .  Huberta Tompkins E. Loukisha Gunnerson, PharmD, BCACP, CPP [facility] [address], [place], Izora Ribas Izora Ribas Phone: [phone]; Fax: [phone] 11/22/2020 9:50 AM  Agree with PharmD above with the following comments: Briefly 34 you M with negative stress test and CP when BP is elevated.  Given that there is a reasonable correlation between BP and CP and with negative stress, primary goal is BP improvement.  If CP worsens or no correlation between BP and CP; will get CCTA  [id number], MD Cardiologist [facility]  [address] [place], [address] [place], [facility] [address] [phone]  10:36 AM

## 2020-12-20 ENCOUNTER — Other Ambulatory Visit: Payer: Self-pay

## 2020-12-20 ENCOUNTER — Encounter: Payer: Self-pay | Admitting: Pharmacist

## 2020-12-20 ENCOUNTER — Ambulatory Visit (INDEPENDENT_AMBULATORY_CARE_PROVIDER_SITE_OTHER): Payer: 59 | Admitting: Pharmacist

## 2020-12-20 VITALS — BP 118/92 | HR 68

## 2020-12-20 DIAGNOSIS — I10 Essential (primary) hypertension: Secondary | ICD-10-CM

## 2020-12-20 LAB — BASIC METABOLIC PANEL
BUN/Creatinine Ratio: 22 — ABNORMAL HIGH (ref 9–20)
BUN: 24 mg/dL — ABNORMAL HIGH (ref 6–20)
CO2: 22 mmol/L (ref 20–29)
Calcium: 9.7 mg/dL (ref 8.7–10.2)
Chloride: 97 mmol/L (ref 96–106)
Creatinine, Ser: 1.07 mg/dL (ref 0.76–1.27)
Glucose: 98 mg/dL (ref 65–99)
Potassium: 4.1 mmol/L (ref 3.5–5.2)
Sodium: 135 mmol/L (ref 134–144)
eGFR: 94 mL/min/{1.73_m2} (ref >59)

## 2020-12-20 NOTE — Progress Notes (Signed)
Patient ID: Dariush Mcnellis                 DOB: [birth year]                      MRN: [id number]     HPI: Bruce Mccormick is a 35 y.o. male referred by Dr. Izora Ribas to HTN clinic. PMH is significant for HTN, depression, anxiety, ADD, and gout. He was recently hospitalized 08/01/2020 for 3-day chest pain with a negative over-the-counter COVID test. He was found to have pneumonia and treated with doxycycline. He was referred to Dr. Tiajuana Amass for f/u on chest pain and was seen in clinic 08/29/2020. He underwent a stress test 09/06/20 which was low risk. Baseline blood pressure was 144/103 mmHg and rose to 224/99 mmHg with no EKG changes or chest pain. Renin and aldosterone both normal. Over the past few months, BP meds have been adjusted as chest pain was felt to be secondary to uncontrolled HTN. At recent visits, amlodipine has been increased to 10mg  daily, HCTZ was changed to chlorthalidone 25mg  daily, carvedilol dose was increased to 25mg  BID, and valsartan 160mg  daily was started.  Pt presents today in good spirits. Tolerating addition of valsartan well. Denies dizziness or balance issues. No big changes in chest pain, previously occurring at decreased frequency about 1x/week and typically comes on with stress and is accompanied by headache.  Trying to eat clean and stay active with his new puppy. Will feel worse after he eats high sodium foods like biscuits. Has been using colchicine as needed if he feels a gout flare coming on. Denies NSAID use. Home BP 136-148 systolic. Will be starting a new job soon which will be day shift instead of 3rd shift.  Current HTN meds:  Amlodipine 10 mg daily - 7am Chlorthalidone 25 mg daily - 7am Carvedilol 25mg  BID - 7am and 9:30pm Valsartan 160mg  daily - 9:30pm  Previously tried: Clonidine 0.1 mg BID - took and "felt worse"  BP goal: <130/80 mmHg  Family History: Heart problems in father/mother, CAD in multiple family members in their 35s  Social History:  Never smoked, 2.0 standard drinks/week  Diet: Mostly snacks and 1 full meal/day d/t being on third shift schedule for his job. On the weekends will eat 2 full meals/day. Usually more carbs and meat. Avoiding caffeine.   Exercise: Used to go to gym, ran a marathon about 5 years ago. States his job as keeps him very active. He would like to get back to the gym but first wants to return to his job. Now walking 90 mins/day with new puppy.   Home BP readings: Has not been checking lately  Labs:  08/01/2020: Na 138, K 3.7, Scr 1.08 09/10/20: Na 141, K 4.3, SCr 0.94  Wt Readings from Last 3 Encounters:  10/10/20 223 lb (101.2 kg)  08/29/20 219 lb (99.3 kg)   BP Readings from Last 3 Encounters:  11/22/20 (!) 156/108  10/24/20 (!) 154/102  10/10/20 (!) 134/94   Pulse Readings from Last 3 Encounters:  11/22/20 74  10/24/20 (!) 103  10/10/20 98    Renal function: CrCl cannot be calculated (Patient's most recent lab result is older than the maximum 21 days allowed.).  Past Medical History:  Diagnosis Date  . ADD (attention deficit disorder)   . Chest pain   . Fatigue   . Frequent headaches   . Heart murmur   . Hypertension   . Palpitations   .  Seasonal allergies     Current Outpatient Medications on File Prior to Visit  Medication Sig Dispense Refill  . amLODipine (NORVASC) 10 MG tablet Take 1 tablet (10 mg total) by mouth daily. 90 tablet 3  . carvedilol (COREG) 25 MG tablet Take 1 tablet (25 mg total) by mouth 2 (two) times daily. 180 tablet 3  . chlorthalidone (HYGROTON) 25 MG tablet Take 1 tablet (25 mg total) by mouth daily. 90 tablet 3  . cyclobenzaprine (FLEXERIL) 10 MG tablet Take 10 mg by mouth as needed for muscle spasms.    . traMADol (ULTRAM) 50 MG tablet Take by mouth as needed.    . valsartan (DIOVAN) 160 MG tablet Take 1 tablet (160 mg total) by mouth daily. 30 tablet 11  . zolpidem (AMBIEN CR) 12.5 MG CR tablet Take 12.5 mg by mouth at bedtime as  needed.     No current facility-administered medications on file prior to visit.    No Known Allergies   Assessment/Plan:  1. Hypertension - BP has improved significantly with addition of valsartan and is now closer to goal <130/80 mmHg, although his diastolic still remains elevated. Will check BMET today with recent addition of valsartan. Advised pt to start checking BP more at home and to bring his readings/cuff to next visit with Dr Izora Ribas in 1 month. Can increase dose of valsartan if needed and BMET is stable. For now, will continue valsartan 160mg  daily, carvedilol 25mg  BID, amlodipine 10mg  daily, and chlorthalidone 25mg  daily. Encouraged pt to stay active and limit sodium intake to <2000 mg daily. Can follow up with PharmD if needed after next MD appt.  Charlina Dwight E. Rojelio Uhrich, PharmD, BCACP, CPP [facility] [address] [place],  Phone: [phone]; Fax: [phone] 12/20/2020 8:01 AM

## 2020-12-20 NOTE — Patient Instructions (Addendum)
It was nice to see you today  Your blood pressure is much improved and closer to your goal <130/67mmHg  Continue taking your current medications for your blood pressure including:  Amlodipine 10 mg daily - 7am Chlorthalidone 25 mg daily - 7am Carvedilol 25mg  twice daily - 7am and 9:30pm Valsartan 160mg  daily - 9:30pm  Continue to stay active and limit your daily sodium intake to 000mg  daily  Monitor/record your blood pressure readings and bring these along with your cuff to your next visit with Dr in 1 month

## 2021-01-21 ENCOUNTER — Ambulatory Visit: Payer: Self-pay | Admitting: Internal Medicine

## 2021-02-08 DIAGNOSIS — H9202 Otalgia, left ear: Secondary | ICD-10-CM | POA: Diagnosis not present

## 2021-03-06 ENCOUNTER — Ambulatory Visit: Payer: Self-pay | Admitting: Internal Medicine

## 2021-03-13 NOTE — Progress Notes (Signed)
Cardiology Office Note:    Date:  03/14/2021   ID:  Bruce Mccormick, DOB 1986-03-10, MRN [id number]  PCP:  [facility] Cardiologist:  Christell Constant, MD  [facility] Electrophysiologist:  None   CC: Follow up chest pain and blood pressure  History of Present Illness:    Bruce Mccormick is a 34 y.o. male with a hx of chest pain and palpitations who presented for evaluation 08/29/20.  In interim of this visit, patient normal stress test; had increase in his regimen: Norvasc 10 mg, Cholthalidone 25 mg Daily, Coreg 3.25 mg BID. Seen 10/10/20.  Since then his medication regimen has been increased:  Norvasc 10, chorthalidone 25 mg PO BID, and Valsartan 160 mg po daily.  BP has improved on this regimen.  Patient seen 03/14/21.  Patient notes that he is doing feeling fine.  Since last visit notes changes.   There are no interval hospital/ED visit.  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  Chest pain still occurs that occurs if he misses his medication dose.  Feels those symptoms when his blood pressure is fine, but notes some times where has had chest pain despite BP control.  Notes that he has some family history of CAD and this make him feel a bit anxious.  No SOB/DOE and no PND/Orthopnea.  No weight gain or leg swelling.  No palpitations or syncope. Feels like his energy level is still not where he wants to be it.   Past Medical History:  Diagnosis Date   ADD (attention deficit disorder)    Chest pain    Fatigue    Frequent headaches    Heart murmur    Hypertension    Palpitations    Seasonal allergies     No past surgical history on file.  Current Medications: Current Meds  Medication Sig   allopurinol (ZYLOPRIM) 100 MG tablet Take 100 mg by mouth daily.   amLODipine (NORVASC) 10 MG tablet Take 1 tablet (10 mg total) by mouth daily.   carvedilol (COREG) 25 MG tablet Take 1 tablet (25 mg total) by mouth 2 (two) times daily.   chlorthalidone (HYGROTON) 25  MG tablet Take 1 tablet (25 mg total) by mouth daily.   metoprolol tartrate (LOPRESSOR) 100 MG tablet Take one tablet (100 mg) bu ymouth two hours prior to your CT   MITIGARE 0.6 MG CAPS Take by mouth as needed.   valsartan (DIOVAN) 160 MG tablet Take 1 tablet (160 mg total) by mouth daily.   zolpidem (AMBIEN CR) 12.5 MG CR tablet Take 12.5 mg by mouth at bedtime as needed.     Allergies:   Patient has no known allergies.   Social History   Socioeconomic History   Marital status: Single    Spouse name: Not on file   Number of children: Not on file   Years of education: Not on file   Highest education level: Not on file  Occupational History   Not on file  Tobacco Use   Smoking status: Never   Smokeless tobacco: Never  Substance and Sexual Activity   Alcohol use: Yes    Alcohol/week: 2.0 standard drinks    Types: 2 Shots of liquor per week   Drug use: Never   Sexual activity: Not on file  Other Topics Concern   Not on file  Social History Narrative   Not on file   Social Determinants of Health   Financial Resource Strain: Not on  file  Food Insecurity: Not on file  Transportation Needs: Not on file  Physical Activity: Not on file  Stress: Not on file  Social Connections: Not on file    Social:  Has a puppy and some cats; 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 History of coronary artery disease notable for multiple family members in 18s. History of heart failure notable for no members.  ROS:   Please see the history of present illness.     All other systems reviewed and are negative.  EKGs/Labs/Other Studies Reviewed:    The following studies were reviewed today:  EKG:   08/01/20:  SR rate 93 WNL  ECG Stress Testing : Date: 09/06/20 Results: Blood pressure demonstrated a hypertensive response to exercise. There was no ST segment  deviation noted during stress. Normal treadmill stress test, Low risk   Donato Schultz, MD   Recent Labs: 08/01/2020: Hemoglobin 15.7; Platelets 215 09/10/2020: Magnesium 2.1 12/20/2020: BUN 24; Creatinine, Ser 1.07; Potassium 4.1; Sodium 135  Recent Lipid Panel No results found for: CHOL, TRIG, HDL, CHOLHDL, VLDL, LDLCALC, LDLDIRECT   Risk Assessment/Calculations:     N/A  Physical Exam:    VS:  BP 102/70   Pulse 72   Ht 5\' 7"  (1.702 m)   Wt 222 lb (100.7 kg)   SpO2 97%   BMI 34.77 kg/m     Wt Readings from Last 3 Encounters:  03/14/21 222 lb (100.7 kg)  10/10/20 223 lb (101.2 kg)  08/29/20 219 lb (99.3 kg)   GEN:  Well nourished, well developed in no acute distress HEENT: Subtle Frank's Sign NECK: No JVD LYMPHATICS: No lymphadenopathy CARDIAC: RRR, no murmurs, rubs, gallops RESPIRATORY:  Clear to auscultation without rales, wheezing or rhonchi  ABDOMEN: Soft, non-tender, non-distended MUSCULOSKELETAL:  No edema; No deformity  SKIN: Warm and dry NEUROLOGIC:  Alert and oriented x 3 PSYCHIATRIC:  Normal affect   ASSESSMENT:    1. Precordial chest pain   2. Essential hypertension   3. Palpitations   4. Family history of early CAD     PLAN:    Precordial chest pain Essential Hypertension Palpitations- resolved Family history of CAD - given family history and CP despite negative stress and BP control, CCTA is reasonable - will get metoprolol 100 mg Po prior, will get nitro prior - will hold valsartan prior to scan - will check lipids  Will follow up in Three months for review of CT Scan  Medication Adjustments/Labs and Tests Ordered: Current medicines are reviewed at length with the patient today.  Concerns regarding medicines are outlined above.  Orders Placed This Encounter  Procedures   CT CORONARY MORPH W/CTA COR W/SCORE W/CA W/CM &/OR WO/CM   Basic Metabolic Panel (BMET)   Lipid Profile    Meds ordered this encounter  Medications   metoprolol  tartrate (LOPRESSOR) 100 MG tablet    Sig: Take one tablet (100 mg) bu ymouth two hours prior to your CT    Dispense:  1 tablet    Refill:  0     Patient Instructions   Medication Instructions:  Your physician recommends that you continue on your current medications as directed. Please refer to the Current Medication list given to you today.  *If you need a refill on your cardiac medications before your next appointment, please call your pharmacy*   Lab Work: BMET  and Lipid If you have labs (blood work) drawn today and your tests are completely normal, you will receive your results only by: MyChart Message (if you have MyChart) OR A paper copy in the mail If you have any lab test that is abnormal or we need to change your treatment, we will call you to review the results.   Testing/Procedures:   Your cardiac CT will be scheduled at one of the below locations:   [facility] [address] [place], [place] [address] [phone]  OR  [facility] [address] [place], [place] [address] [phone]  If scheduled at [facility], please arrive at the [facility] main entrance (entrance A) of [facility] 30 minutes prior to test start time. Proceed to the [facility] Radiology Department (first floor) to check-in and test prep.  If scheduled at [facility], please arrive 15 mins early for check-in and test prep.  Please follow these instructions carefully (unless otherwise directed):  Hold all erectile dysfunction medications at least 3 days (72 hrs) prior to test.  On the Night Before the Test: Be sure to Drink plenty of water. Do not consume any caffeinated/decaffeinated beverages or chocolate 12 hours prior to your test. Do not take any antihistamines 12 hours prior to your test.  On the Day of the Test: Drink plenty of water until 1 hour prior to the test. Do not  eat any food 4 hours prior to the test. You may take your regular medications prior to the test.  Take metoprolol (Lopressor) two hours prior to test. HOLD Furosemide/Hydrochlorothiazide morning of the test. HOLD Valsartan morning of the procedure        After the Test: Drink plenty of water. After receiving IV contrast, you may experience a mild flushed feeling. This is normal. On occasion, you may experience a mild rash up to 24 hours after the test. This is not dangerous. If this occurs, you can take Benadryl 25 mg and increase your fluid intake. If you experience trouble breathing, this can be serious. If it is severe call 911 IMMEDIATELY. If it is mild, please call our office. If you take any of these medications: Glipizide/Metformin, Avandament, Glucavance, please do not take 48 hours after completing test unless otherwise instructed.  Please allow 2-4 weeks for scheduling of routine cardiac CTs. Some insurance companies require a pre-authorization which may delay scheduling of this test.   For non-scheduling related questions, please contact the cardiac imaging nurse navigator should you have any questions/concerns: Rockwell Alexandria, Cardiac Imaging Nurse Navigator Larey Brick, Cardiac Imaging Nurse Navigator [facility] Direct Office Dial: [phone]   For scheduling needs, including cancellations and rescheduling, please call [place], [phone].    Follow-Up: At [facility], you and your health needs are our priority.  As part of our continuing mission to provide you with exceptional heart care, we have created designated Provider Care Teams.  These Care Teams include your primary Cardiologist (physician) and Advanced Practice Providers (APPs -  Physician Assistants and Nurse Practitioners) who all work together to provide you with the care you need, when you need it.  We recommend signing up for the patient portal called "MyChart".  Sign up  information is provided on this After Visit Summary.  MyChart is used to connect with patients for Virtual Visits (Telemedicine).  Patients are able to view lab/test results, encounter notes, upcoming appointments, etc.  Non-urgent  messages can be sent to your provider as well.   To learn more about what you can do with MyChart, go to [email or web address].    Your next appointment:   3 month(s)  The format for your next appointment:   In Person  Provider:   Riley LamMahesh Jailah Willis, MD   Other Instructions    Signed, Christell ConstantMahesh A Sophya Vanblarcom, MD  03/14/2021 9:12 AM    [facility]

## 2021-03-14 ENCOUNTER — Other Ambulatory Visit: Payer: Self-pay

## 2021-03-14 ENCOUNTER — Encounter: Payer: Self-pay | Admitting: Internal Medicine

## 2021-03-14 ENCOUNTER — Ambulatory Visit: Payer: BC Managed Care – PPO | Admitting: Internal Medicine

## 2021-03-14 VITALS — BP 102/70 | HR 72 | Ht 67.0 in | Wt 222.0 lb

## 2021-03-14 DIAGNOSIS — R002 Palpitations: Secondary | ICD-10-CM | POA: Diagnosis not present

## 2021-03-14 DIAGNOSIS — I10 Essential (primary) hypertension: Secondary | ICD-10-CM | POA: Diagnosis not present

## 2021-03-14 DIAGNOSIS — R072 Precordial pain: Secondary | ICD-10-CM | POA: Diagnosis not present

## 2021-03-14 DIAGNOSIS — Z8249 Family history of ischemic heart disease and other diseases of the circulatory system: Secondary | ICD-10-CM | POA: Diagnosis not present

## 2021-03-14 LAB — LIPID PANEL
Chol/HDL Ratio: 5.7 ratio — ABNORMAL HIGH (ref 0.0–5.0)
Cholesterol, Total: 247 mg/dL — ABNORMAL HIGH (ref 100–199)
HDL: 43 mg/dL (ref >39)
LDL Chol Calc (NIH): 163 mg/dL — ABNORMAL HIGH (ref 0–99)
Triglycerides: 220 mg/dL — ABNORMAL HIGH (ref 0–149)
VLDL Cholesterol Cal: 41 mg/dL — ABNORMAL HIGH (ref 5–40)

## 2021-03-14 LAB — BASIC METABOLIC PANEL
BUN/Creatinine Ratio: 11 (ref 9–20)
BUN: 10 mg/dL (ref 6–20)
CO2: 23 mmol/L (ref 20–29)
Calcium: 9.7 mg/dL (ref 8.7–10.2)
Chloride: 99 mmol/L (ref 96–106)
Creatinine, Ser: 0.88 mg/dL (ref 0.76–1.27)
Glucose: 107 mg/dL — ABNORMAL HIGH (ref 65–99)
Potassium: 3.8 mmol/L (ref 3.5–5.2)
Sodium: 138 mmol/L (ref 134–144)
eGFR: 116 mL/min/{1.73_m2} (ref >59)

## 2021-03-14 MED ORDER — METOPROLOL TARTRATE 100 MG PO TABS: ORAL_TABLET | ORAL | 0 refills | Status: AC

## 2021-03-14 NOTE — Patient Instructions (Addendum)
Medication Instructions:  Your physician recommends that you continue on your current medications as directed. Please refer to the Current Medication list given to you today.  *If you need a refill on your cardiac medications before your next appointment, please call your pharmacy*   Lab Work: BMET and Lipid If you have labs (blood work) drawn today and your tests are completely normal, you will receive your results only by: MyChart Message (if you have MyChart) OR A paper copy in the mail If you have any lab test that is abnormal or we need to change your treatment, we will call you to review the results.   Testing/Procedures:   Your cardiac CT will be scheduled at one of the below locations:   [facility] [address] [place], [place] [address] [phone]  OR  [facility] [address] [place], [place] [address] [phone]  If scheduled at [facility], please arrive at the [facility] main entrance (entrance A) of [facility] 30 minutes prior to test start time. Proceed to the [facility] Radiology Department (first floor) to check-in and test prep.  If scheduled at [facility], please arrive 15 mins early for check-in and test prep.  Please follow these instructions carefully (unless otherwise directed):  Hold all erectile dysfunction medications at least 3 days (72 hrs) prior to test.  On the Night Before the Test: Be sure to Drink plenty of water. Do not consume any caffeinated/decaffeinated beverages or chocolate 12 hours prior to your test. Do not take any antihistamines 12 hours prior to your test.  On the Day of the Test: Drink plenty of water until 1 hour prior to the test. Do not eat any food 4 hours prior to the test. You may take your regular medications prior to the test.  Take metoprolol (Lopressor) two hours prior to test. HOLD  Furosemide/Hydrochlorothiazide morning of the test. HOLD Valsartan morning of the procedure        After the Test: Drink plenty of water. After receiving IV contrast, you may experience a mild flushed feeling. This is normal. On occasion, you may experience a mild rash up to 24 hours after the test. This is not dangerous. If this occurs, you can take Benadryl 25 mg and increase your fluid intake. If you experience trouble breathing, this can be serious. If it is severe call 911 IMMEDIATELY. If it is mild, please call our office. If you take any of these medications: Glipizide/Metformin, Avandament, Glucavance, please do not take 48 hours after completing test unless otherwise instructed.  Please allow 2-4 weeks for scheduling of routine cardiac CTs. Some insurance companies require a pre-authorization which may delay scheduling of this test.   For non-scheduling related questions, please contact the cardiac imaging nurse navigator should you have any questions/concerns: Rockwell Alexandria, Cardiac Imaging Nurse Navigator Larey Brick, Cardiac Imaging Nurse Navigator [facility] Direct Office Dial: [phone]   For scheduling needs, including cancellations and rescheduling, please call [place], [phone].    Follow-Up: At [facility], you and your health needs are our priority.  As part of our continuing mission to provide you with exceptional heart care, we have created designated Provider Care Teams.  These Care Teams include your primary Cardiologist (physician) and Advanced Practice Providers (APPs -  Physician Assistants and Nurse Practitioners) who all work together to provide you with the care you need, when you need it.  We recommend signing up for the patient portal called "MyChart".  Sign up information is provided on this After Visit Summary.  MyChart is used to connect with patients for Virtual Visits (Telemedicine).  Patients are able to view  lab/test results, encounter notes, upcoming appointments, etc.  Non-urgent messages can be sent to your provider as well.   To learn more about what you can do with MyChart, go to [email or web address].    Your next appointment:   3 month(s)  The format for your next appointment:   In Person  Provider:   Riley Lam, MD   Other Instructions

## 2021-03-21 ENCOUNTER — Telehealth (HOSPITAL_COMMUNITY): Payer: Self-pay | Admitting: Emergency Medicine

## 2021-03-21 NOTE — Telephone Encounter (Signed)
Attempted to call patient regarding upcoming cardiac CT appointment. °Left message on voicemail with name and callback number °Ludmilla Mcgillis RN Navigator Cardiac Imaging °[facility] °[phone] Office °[phone] Cell ° °

## 2021-03-22 ENCOUNTER — Telehealth (HOSPITAL_COMMUNITY): Payer: Self-pay | Admitting: Emergency Medicine

## 2021-03-22 ENCOUNTER — Other Ambulatory Visit: Payer: Self-pay

## 2021-03-22 ENCOUNTER — Ambulatory Visit (HOSPITAL_COMMUNITY)
Admission: RE | Admit: 2021-03-22 | Discharge: 2021-03-22 | Disposition: A | Discharge status: Home / Self Care | Payer: BC Managed Care – PPO | Source: Ambulatory Visit | Attending: Internal Medicine | Admitting: Internal Medicine

## 2021-03-22 ENCOUNTER — Encounter (HOSPITAL_COMMUNITY): Payer: Self-pay

## 2021-03-22 DIAGNOSIS — Z8249 Family history of ischemic heart disease and other diseases of the circulatory system: Secondary | ICD-10-CM | POA: Insufficient documentation

## 2021-03-22 DIAGNOSIS — R002 Palpitations: Secondary | ICD-10-CM | POA: Insufficient documentation

## 2021-03-22 DIAGNOSIS — I10 Essential (primary) hypertension: Secondary | ICD-10-CM | POA: Insufficient documentation

## 2021-03-22 DIAGNOSIS — R072 Precordial pain: Secondary | ICD-10-CM | POA: Insufficient documentation

## 2021-03-22 MED ORDER — NITROGLYCERIN 0.4 MG SL SUBL
SUBLINGUAL_TABLET | SUBLINGUAL | Status: AC
  Filled 2021-03-22: qty 2

## 2021-03-22 MED ORDER — NITROGLYCERIN 0.4 MG SL SUBL
0.8000 mg | SUBLINGUAL_TABLET | Freq: Once | SUBLINGUAL | Status: AC
  Administered 2021-03-22: 0.8 mg via SUBLINGUAL

## 2021-03-22 MED ORDER — IOHEXOL 350 MG/ML SOLN
100.0000 mL | Freq: Once | INTRAVENOUS | Status: AC | PRN
  Administered 2021-03-22: 100 mL via INTRAVENOUS

## 2021-03-22 NOTE — Telephone Encounter (Signed)
Pt returning phone call regarding upcoming cardiac imaging study; pt verbalizes understanding of appt date/time, parking situation and where to check in, pre-test NPO status and medications ordered, and verified current allergies; name and call back number provided for further questions should they arise Rockwell Alexandria RN Navigator Cardiac Imaging [facility] [phone] office [phone] cell  100mg  metoprolol 2 hr prior to scan Denies iv issues Denies claustro Hold valsartan

## 2021-04-12 DIAGNOSIS — F5104 Psychophysiologic insomnia: Secondary | ICD-10-CM | POA: Diagnosis not present

## 2021-04-12 DIAGNOSIS — M109 Gout, unspecified: Secondary | ICD-10-CM | POA: Diagnosis not present

## 2021-06-19 ENCOUNTER — Ambulatory Visit: Payer: BC Managed Care – PPO | Admitting: Internal Medicine

## 2021-06-19 ENCOUNTER — Encounter: Payer: Self-pay | Admitting: Internal Medicine

## 2021-06-19 NOTE — Progress Notes (Deleted)
Cardiology Office Note:    Date:  06/19/2021   ID:  Bruce Mccormick, DOB 1986-06-16, MRN [id number]  PCP:  [facility] Cardiologist:  Christell Constant, MD  [facility] Electrophysiologist:  None   CC: Follow up chest pain and blood pressure  History of Present Illness:    Bruce Mccormick is a 35 y.o. male with a hx of chest pain and palpitations who presented for evaluation 08/29/20.  In interim of this visit, patient normal stress test; had increase in his regimen: Norvasc 10 mg, Cholthalidone 25 mg Daily, Coreg 3.25 mg BID. Seen 10/10/20.  Since then his medication regimen has been increased:  Norvasc 10, chorthalidone 25 mg PO BID, and Valsartan 160 mg po daily.  BP has improved on this regimen.  Patient seen 03/14/21.  Still having residual CP at that visit had normal Cardiac CT.  Seen 06/19/21.  Patient notes that he is doing ***.  Since day prior/last visit notes *** changes.  Relevant interval testing or therapy include ***.  There are no*** interval hospital/ED visit.    No chest pain or pressure ***.  No SOB/DOE*** and no PND/Orthopnea***.  No weight gain or leg swelling***.  No palpitations or syncope ***.  Ambulatory blood pressure ***.    Past Medical History:  Diagnosis Date   ADD (attention deficit disorder)    Chest pain    Fatigue    Frequent headaches    Heart murmur    Hypertension    Palpitations    Seasonal allergies     No past surgical history on file.  Current Medications: No outpatient medications have been marked as taking for the 06/19/21 encounter (Appointment) with Christell Constant, MD.     Allergies:   Patient has no known allergies.   Social History   Socioeconomic History   Marital status: Single    Spouse name: Not on file   Number of children: Not on file   Years of education: Not on file   Highest education level: Not on file  Occupational History   Not on file  Tobacco Use   Smoking  status: Never   Smokeless tobacco: Never  Vaping Use   Vaping Use: Every day  Substance and Sexual Activity   Alcohol use: Yes    Alcohol/week: 2.0 standard drinks    Types: 2 Shots of liquor per week   Drug use: Never   Sexual activity: Not on file  Other Topics Concern   Not on file  Social History Narrative   Not on file   Social Determinants of Health   Financial Resource Strain: Not on file  Food Insecurity: Not on file  Transportation Needs: Not on file  Physical Activity: Not on file  Stress: Not on file  Social Connections: Not on file    Social:  Has a puppy and some cats; 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 History of coronary artery disease notable for multiple family members in 30s. History of heart failure notable for no members.  ROS:   Please see the history of present illness.     All other systems reviewed and are negative.  EKGs/Labs/Other Studies Reviewed:    The following studies were reviewed today:  EKG:   08/01/20:  SR rate 93 WNL  ECG Stress Testing : Date: 09/06/20 Results:  Blood pressure demonstrated a hypertensive response to exercise. There was no ST segment deviation noted during stress. Normal treadmill stress test, Low risk   Donato Schultz, MD   NonCardiac CT: Date: 03/22/21 Results: IMPRESSION: 1. Coronary calcium score of 0. This was 1st percentile for age, sex, and race matched control.   2. Normal coronary origin with right dominance.   3. CAD-RADS 0. No evidence of CAD. Consider non-atherosclerotic causes of chest pain.   4. 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 This can be associated with the presence of pulmonary hypertension; clinical correlation advised.  Recent Labs: 08/01/2020: Hemoglobin 15.7; Platelets 215 09/10/2020: Magnesium  2.1 03/14/2021: BUN 10; Creatinine, Ser 0.88; Potassium 3.8; Sodium 138  Recent Lipid Panel    Component Value Date/Time   CHOL 247 (H) 03/14/2021 0848   TRIG 220 (H) 03/14/2021 0848   HDL 43 03/14/2021 0848   CHOLHDL 5.7 (H) 03/14/2021 0848   LDLCALC 163 (H) 03/14/2021 0848     Risk Assessment/Calculations:     N/A  Physical Exam:    VS:  There were no vitals taken for this visit.    Wt Readings from Last 3 Encounters:  03/14/21 222 lb (100.7 kg)  10/10/20 223 lb (101.2 kg)  08/29/20 219 lb (99.3 kg)   GEN:  Well nourished, well developed in no acute distress HEENT: Subtle Frank's Sign NECK: No JVD LYMPHATICS: No lymphadenopathy CARDIAC: RRR, no murmurs, rubs, gallops RESPIRATORY:  Clear to auscultation without rales, wheezing or rhonchi  ABDOMEN: Soft, non-tender, non-distended MUSCULOSKELETAL:  No edema; No deformity  SKIN: Warm and dry NEUROLOGIC:  Alert and oriented x 3 PSYCHIATRIC:  Normal affect   ASSESSMENT:    No diagnosis found.   PLAN:    Precordial chest pain Essential Hypertension Palpitations- resolved Family history of CAD Mild Main PA dilation without clinical syndrome of PH  - LDL goal  - continue home antihypertensives but ***  Will plan for *** follow up unless new symptoms or abnormal test results warranting change in plan  Would be reasonable for *** APP Follow up   Medication Adjustments/Labs and Tests Ordered: Current medicines are reviewed at length with the patient today.  Concerns regarding medicines are outlined above.  No orders of the defined types were placed in this encounter.   No orders of the defined types were placed in this encounter.    There are no Patient Instructions on file for this visit.   Signed, Christell Constant, MD  06/19/2021 9:14 AM    [facility]

## 2021-09-22 ENCOUNTER — Other Ambulatory Visit: Payer: Self-pay | Admitting: Internal Medicine

## 2021-10-13 ENCOUNTER — Other Ambulatory Visit: Payer: Self-pay | Admitting: Internal Medicine

## 2021-11-12 ENCOUNTER — Other Ambulatory Visit: Payer: Self-pay | Admitting: Internal Medicine

## 2022-02-10 ENCOUNTER — Other Ambulatory Visit: Payer: Self-pay | Admitting: Internal Medicine

## 2022-02-11 ENCOUNTER — Other Ambulatory Visit: Payer: Self-pay | Admitting: Internal Medicine

## 2022-02-16 ENCOUNTER — Other Ambulatory Visit: Payer: Self-pay | Admitting: Internal Medicine

## 2022-02-17 ENCOUNTER — Other Ambulatory Visit: Payer: Self-pay | Admitting: Internal Medicine

## 2022-03-12 ENCOUNTER — Other Ambulatory Visit: Payer: Self-pay

## 2022-03-12 MED ORDER — AMLODIPINE BESYLATE 10 MG PO TABS: 10.0000 mg | ORAL_TABLET | Freq: Every day | ORAL | 0 refills | Status: DC

## 2022-03-12 MED ORDER — CARVEDILOL 25 MG PO TABS: 25.0000 mg | ORAL_TABLET | Freq: Two times a day (BID) | ORAL | 0 refills | Status: DC

## 2022-03-12 NOTE — Addendum Note (Signed)
Addended by: Margaret Pyle D on: 03/12/2022 07:54 AM   Modules accepted: Orders

## 2022-03-20 ENCOUNTER — Other Ambulatory Visit: Payer: Self-pay | Admitting: Internal Medicine

## 2022-04-04 ENCOUNTER — Other Ambulatory Visit: Payer: Self-pay | Admitting: Internal Medicine

## 2022-04-09 IMAGING — CT CT HEART MORP W/ CTA COR W/ SCORE W/ CA W/CM &/OR W/O CM
1 series · 5 of 9 positions shown, 7 images · non-contrast
Comparison: None.
COMPARISON: None.

Addendum:
EXAM:
OVER-READ INTERPRETATION  CT CHEST

The following report is an over-read performed by radiologist Dr.
Bledion Gad [REDACTED] on 03/22/2021. This
over-read does not include interpretation of cardiac or coronary
anatomy or pathology. The coronary calcium score/coronary CTA
interpretation by the cardiologist is attached.
CLINICAL DATA: 35 Year-old White Male
Cardiac/Coronary  CTA
TECHNIQUE: The patient was scanned on a [employer] scanner.

[Series 1266: — · 5 of 9 slices shown, 7 images]
[im 2/9  vessel]
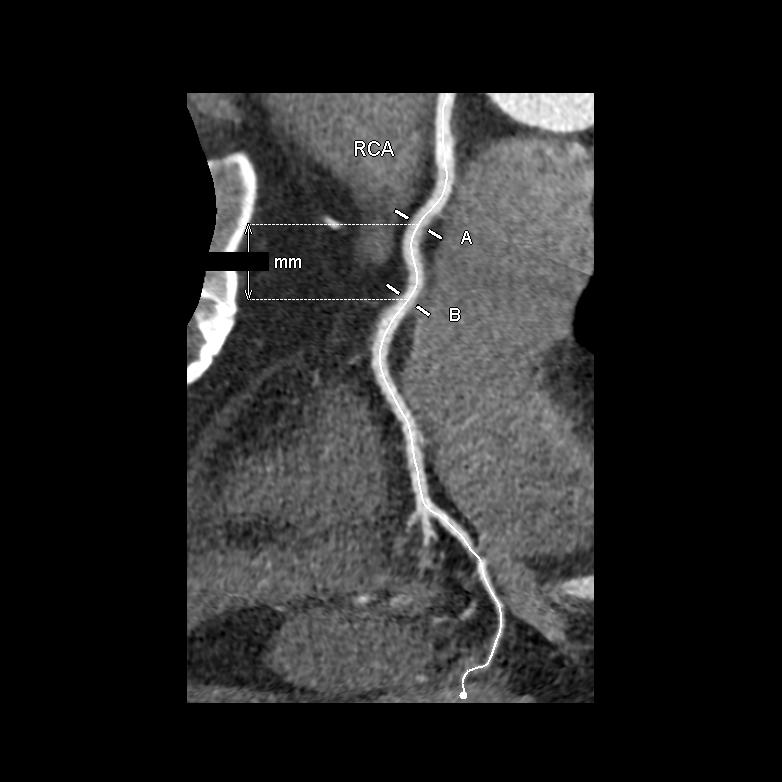
[im 2/9  lung]
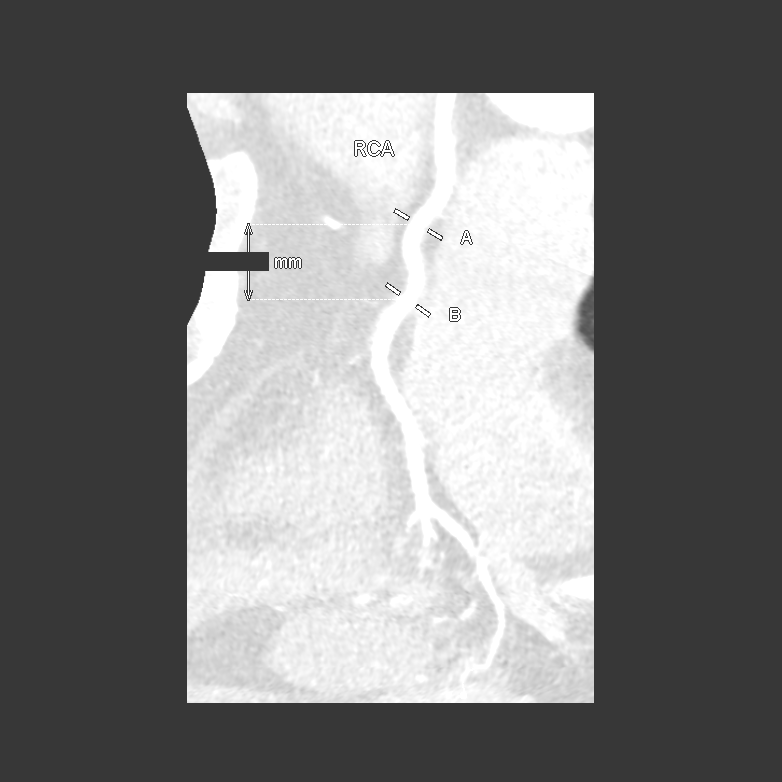
[im 4/9  vessel]
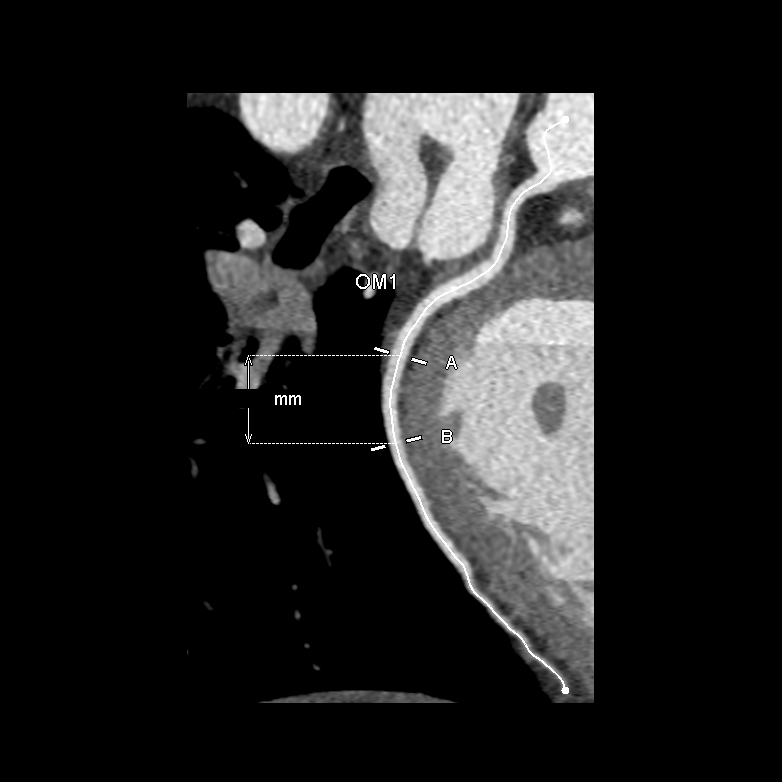
[im 5/9  vessel]
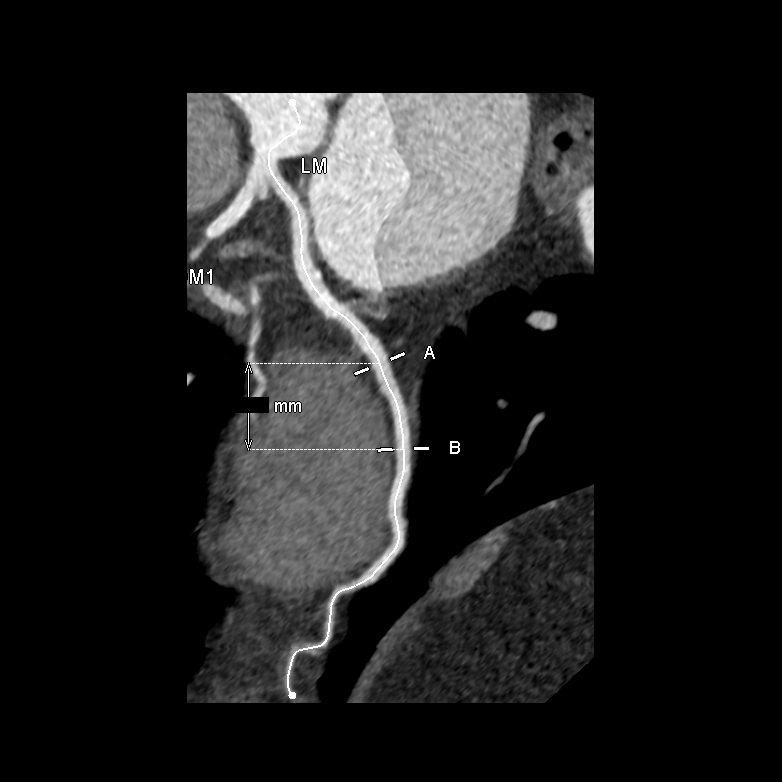
[im 6/9  vessel]
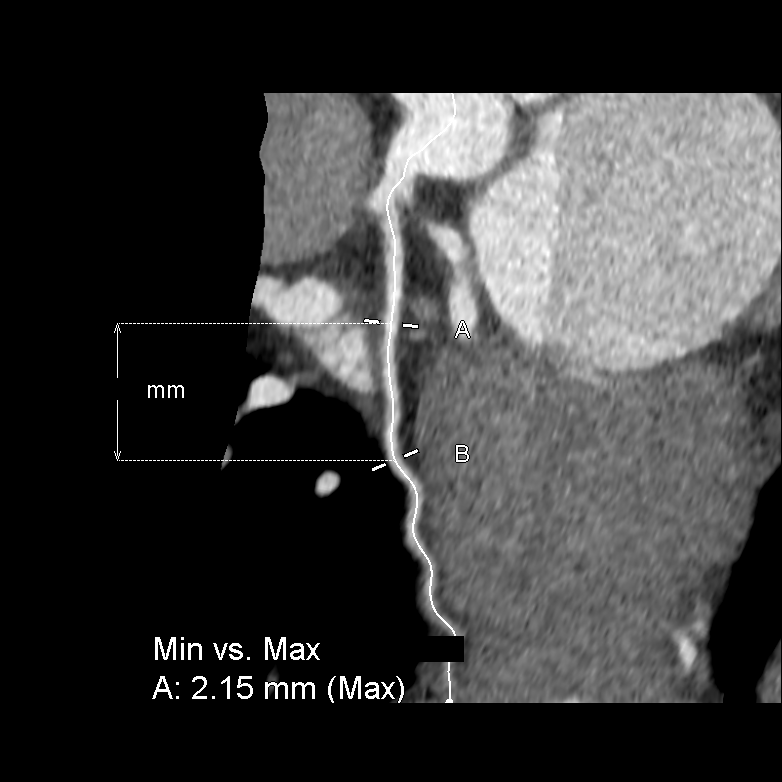
[im 8/9  vessel]
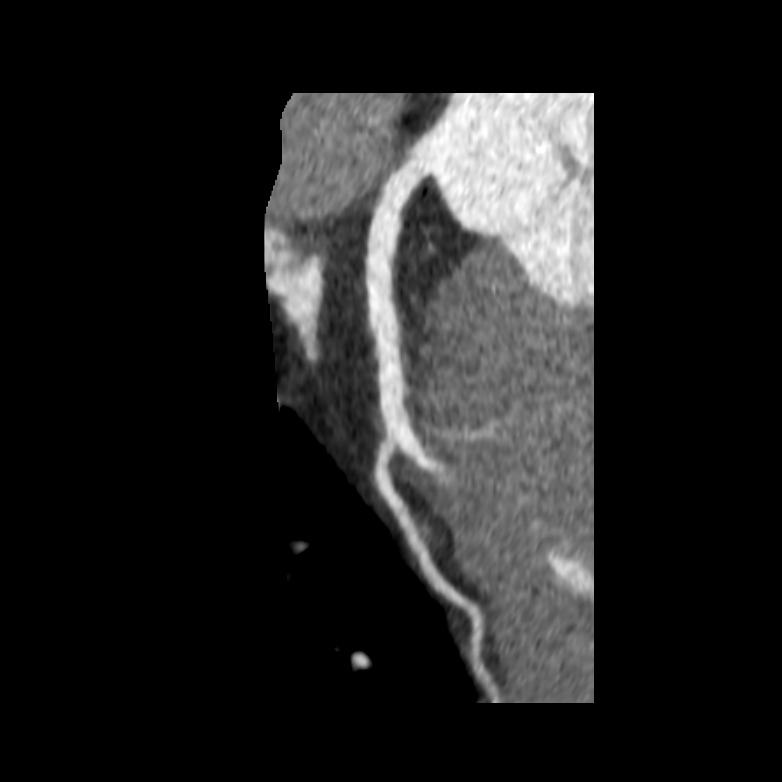
[im 8/9  lung]
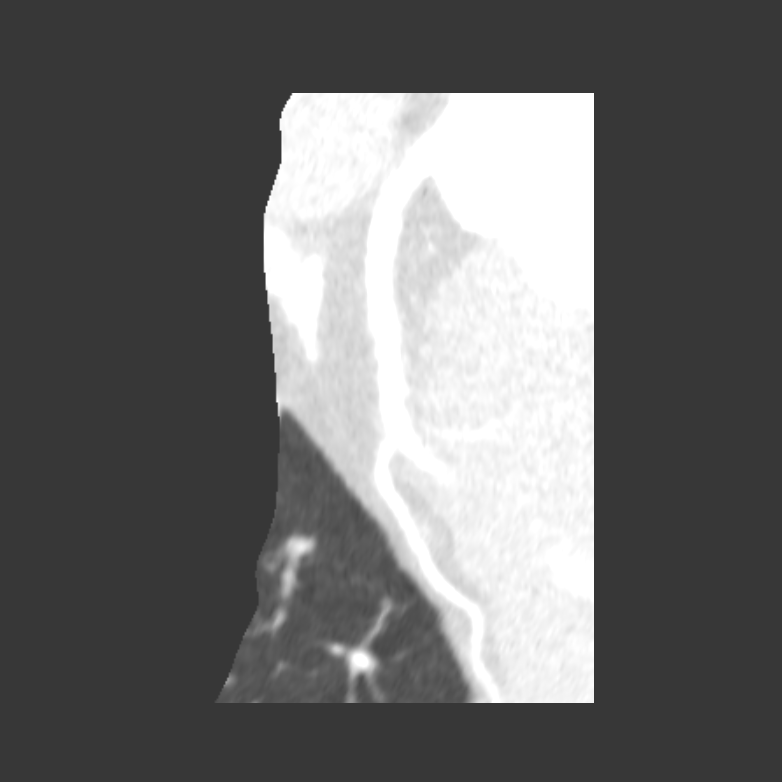

[5 of 9 positions shown; findings below may reference images not displayed]

FINDINGS: Tiny calcified granuloma in the anterior aspect of the right upper
lobe. Within the visualized portions of the thorax there are no
suspicious appearing pulmonary nodules or masses, there is no acute
consolidative airspace disease, no pleural effusions, no
pneumothorax and no lymphadenopathy. Visualized portions of the
upper abdomen are unremarkable. There are no aggressive appearing
lytic or blastic lesions noted in the visualized portions of the
skeleton.
IMPRESSION: No significant incidental noncardiac findings are noted.
FINDINGS: A 100 kV prospective scan was triggered in the descending thoracic
aorta at 111 HU's. Axial non-contrast 3 mm slices were carried out
through the heart. The data set was analyzed on a dedicated work
station and scored using the Agatson method. Gantry rotation speed
was 250 msecs and collimation was .6 mm. No beta blockade and 0.8 mg
of sl NTG was given. The 3D data set was reconstructed in 5%
intervals of the 67-82 % of the R-R cycle. Diastolic phases were
analyzed on a dedicated work station using MPR, MIP and VRT modes.
The patient received 100 cc of contrast.

Aorta:  Normal size.  No calcifications.  No dissection.

Main Pulmonary Artery: Mild to moderate dilation of the main
pulmonary artery 32 mm with trunk to aorta ratio of 0.97.

Aortic Valve:  Tri-leaflet.  No calcifications.

Coronary Arteries:  Normal coronary origin.  Right dominance.

Coronary Calcium Score:

Left main: 0

Left anterior descending artery: 0

Left circumflex artery: 0

Right coronary artery: 0

Total: 0

Percentile: 1st for age, sex, and race matched control.

RCA is a large dominant artery that gives rise to PDA and PLA. There
is no significant plaque.

Left main is a large artery that gives rise to LAD, Ramus
Intermedius, and LCX arteries. There is no significant plaque.

LAD is a large vessel that gives rise to two diagonal vessels. There
is no significant plaque.

LCX is a non-dominant artery that gives rise to one large OM1
branch. There is no significant plaque.

There is a ramus intermedius vessel. There is no significant plaque.

Other findings:

Normal pulmonary vein drainage into the left atrium.

Normal left atrial appendage without a thrombus.

Extra-cardiac findings: See attached radiology report for
non-cardiac structures.
IMPRESSION: 1. Coronary calcium score of 0. This was 1st percentile for age,
sex, and race matched control.

2. Normal coronary origin with right dominance.

3. CAD-RADS 0. No evidence of CAD. Consider non-atherosclerotic
causes of chest pain.

4. Mild to moderate dilation of the main pulmonary artery 32 mm with
trunk to aorta ratio of 0.97. This can be associated with the
presence of pulmonary hypertension; clinical correlation advised.

RECOMMENDATIONS:



If CAC = 0, it is reasonable to withhold statin therapy and reassess
in 5 to 10 years, as long as higher risk conditions are absent
(diabetes mellitus, family history of premature CHD in first degree
relatives (males <55 years; females <65 years), cigarette smoking,
LDL >=190 mg/dL or other independent risk factors).

If CAC is 1 to 99, it is reasonable to initiate statin therapy for
patients >=55 years of age.

If CAC is >=100 or >=75th percentile, it is reasonable to initiate
statin therapy at any age.

Cardiology referral should be considered for patients with CAC
scores =400 or >=75th percentile.

*7524 AHA/ACC/AACVPR/AAPA/ABC/[employer]/[place]/ZEINAB/[place]/BAJONERO/SAETRAN/ANSELMO
Guideline on the Management of Blood Cholesterol: A Report of the
American College of Cardiology/American Heart Association Task Force
on Clinical Practice Guidelines. J Am Coll Cardiol.
0817;73(24):3023-3640.

*** End of Addendum ***
EXAM:
OVER-READ INTERPRETATION  CT CHEST

The following report is an over-read performed by radiologist Dr.
Bledion Gad [REDACTED] on 03/22/2021. This
over-read does not include interpretation of cardiac or coronary
anatomy or pathology. The coronary calcium score/coronary CTA
interpretation by the cardiologist is attached.
FINDINGS: Tiny calcified granuloma in the anterior aspect of the right upper
lobe. Within the visualized portions of the thorax there are no
suspicious appearing pulmonary nodules or masses, there is no acute
consolidative airspace disease, no pleural effusions, no
pneumothorax and no lymphadenopathy. Visualized portions of the
upper abdomen are unremarkable. There are no aggressive appearing
lytic or blastic lesions noted in the visualized portions of the
skeleton.
IMPRESSION: No significant incidental noncardiac findings are noted.

## 2022-04-12 ENCOUNTER — Other Ambulatory Visit: Payer: Self-pay | Admitting: Internal Medicine

## 2022-04-27 ENCOUNTER — Other Ambulatory Visit: Payer: Self-pay | Admitting: Internal Medicine

## 2022-05-25 ENCOUNTER — Other Ambulatory Visit: Payer: Self-pay | Admitting: Internal Medicine

## 2022-06-05 ENCOUNTER — Other Ambulatory Visit: Payer: Self-pay | Admitting: Internal Medicine

## 2022-06-05 ENCOUNTER — Other Ambulatory Visit: Payer: Self-pay | Admitting: *Deleted

## 2022-06-05 NOTE — Telephone Encounter (Signed)
This will be the last refill until follow up.  He can see APP, but given the risks of hyperkalemia on this medication should be seen Can also get this med from PCP.

## 2022-06-06 ENCOUNTER — Telehealth: Payer: Self-pay

## 2022-06-10 NOTE — Telephone Encounter (Signed)
Pt needs to schedule overdue OV.  At 03/14/21 OV was instructed to come back in 3 months.

## 2022-06-21 ENCOUNTER — Other Ambulatory Visit: Payer: Self-pay | Admitting: Internal Medicine
